# Patient Record
Sex: Female | Born: 1980 | Race: White | Hispanic: No | Marital: Married | Smoking: Never smoker
Health system: Southern US, Community
[De-identification: ages and names within clinical notes are randomized; demographics above are authoritative.]

## PROBLEM LIST (undated history)

## (undated) DIAGNOSIS — T7840XA Allergy, unspecified, initial encounter: Secondary | ICD-10-CM

## (undated) DIAGNOSIS — C801 Malignant (primary) neoplasm, unspecified: Secondary | ICD-10-CM

## (undated) DIAGNOSIS — I10 Essential (primary) hypertension: Secondary | ICD-10-CM

## (undated) DIAGNOSIS — Z0000 Encounter for general adult medical examination without abnormal findings: Secondary | ICD-10-CM

## (undated) DIAGNOSIS — B001 Herpesviral vesicular dermatitis: Principal | ICD-10-CM

## (undated) DIAGNOSIS — N939 Abnormal uterine and vaginal bleeding, unspecified: Secondary | ICD-10-CM

## (undated) DIAGNOSIS — Z1231 Encounter for screening mammogram for malignant neoplasm of breast: Secondary | ICD-10-CM

## (undated) DIAGNOSIS — E559 Vitamin D deficiency, unspecified: Secondary | ICD-10-CM

## (undated) HISTORY — DX: Essential (primary) hypertension: I10

## (undated) HISTORY — DX: Allergy, unspecified, initial encounter: T78.40XA

## (undated) HISTORY — DX: Malignant (primary) neoplasm, unspecified: C80.1

---

## 2018-02-16 ENCOUNTER — Telehealth: Payer: Self-pay | Admitting: Internal Medicine

## 2018-02-16 NOTE — Telephone Encounter (Signed)
Fax number is [phone]

## 2018-02-16 NOTE — Telephone Encounter (Signed)
Called patient and she will come by our office at her earliest convenience to sign a release form for us to fax to her previous cardiologist. [facility] as it may help them with chart prep.

## 2018-02-16 NOTE — Telephone Encounter (Signed)
[place] and establishing care with us [facility] calling in regards to transitioning medical records Patient requested they fax the information but they will need a fax from us Please advise [facility] also wanted to make office aware they may be unable to refill any medication as she has not been seen there in over a year

## 2018-02-17 NOTE — Telephone Encounter (Signed)
Thank you Victorino DikeJennifer for contacting patient!!  Yes, Pt needs to come by office to sign ROI for medical Records.

## 2018-03-11 ENCOUNTER — Ambulatory Visit: Payer: BLUE CROSS/BLUE SHIELD | Admitting: Physician Assistant

## 2018-03-11 ENCOUNTER — Encounter: Payer: Self-pay | Admitting: Physician Assistant

## 2018-03-11 DIAGNOSIS — N6314 Unspecified lump in the right breast, lower inner quadrant: Secondary | ICD-10-CM

## 2018-03-11 DIAGNOSIS — Z23 Encounter for immunization: Secondary | ICD-10-CM

## 2018-03-11 DIAGNOSIS — M722 Plantar fascial fibromatosis: Secondary | ICD-10-CM

## 2018-03-11 DIAGNOSIS — I10 Essential (primary) hypertension: Secondary | ICD-10-CM | POA: Diagnosis not present

## 2018-03-11 DIAGNOSIS — G5603 Carpal tunnel syndrome, bilateral upper limbs: Secondary | ICD-10-CM

## 2018-03-11 DIAGNOSIS — Z1239 Encounter for other screening for malignant neoplasm of breast: Secondary | ICD-10-CM

## 2018-03-11 DIAGNOSIS — L309 Dermatitis, unspecified: Secondary | ICD-10-CM

## 2018-03-11 MED ORDER — TRIAMCINOLONE ACETONIDE 0.1 % EX CREA: 1.0000 "application " | TOPICAL_CREAM | Freq: Two times a day (BID) | CUTANEOUS | 0 refills | Status: DC

## 2018-03-11 MED ORDER — LABETALOL HCL 100 MG PO TABS: 100.0000 mg | ORAL_TABLET | Freq: Every day | ORAL | 1 refills | Status: DC

## 2018-03-11 NOTE — Progress Notes (Signed)
Patient: Pamela Daniels, Female    DOB: [birth year], 37 y.o.   MRN: [id number] Visit Date: 03/17/2018  Today's Provider: [name], PA-C   Chief Complaint  Patient presents with  . New Patient (Initial Visit)   Subjective:   Patient here today to establish care, patient is transferring from [facility]. She moved here with her family after her husband got a job at [employer] working with the basketball team. She is an Network engineer.  Patient did not have a PCP, only OB/GYN. She believes she had a PAP smear within the past 3 years.  Patient is c/o lump on right breast patient reports is has been scanned and u/s and reports were normal. Reports she was told she could have it removed but at the time it was not bothering her. Now however, patient reports lump is getting bigger and reports it is beginning to bother her more frequently. She denies any discharge or skin changes in the area.  She has a history of tachycardia and elevated BP.Patient reports that her blood pressure has been well controlled and does want to come off medication. Previously when she had high blood pressure, she was taken off combo OCP and placed on mini pill.   Patient reports some swelling around ankles and pain in feet. She reports she has had pain in her feet since she was a teenager. It is deep pain, worse when she takes a step in the morning. She has gotten supportive orthotic shoes. She uses anti-inflammatories sparingly with some relief.  Patient reports numbness on hands, mostly in the first two fingers. Worse in the morning.   Also reports there is a spot on the back of her neck that has been there a while. It itches. It does not bleed. She reports she used some OTC steroid cream with some relief but not complete resolution. -----------------------------------------------------------------   Review of Systems  Constitutional: Negative.   HENT: Negative.   Eyes: Positive for pain and  itching.  Respiratory: Negative.   Cardiovascular: Positive for leg swelling.  Gastrointestinal: Negative.   Endocrine: Negative.   Musculoskeletal: Positive for back pain, neck pain and neck stiffness.  Skin: Positive for rash.  Allergic/Immunologic: Positive for environmental allergies.  Neurological: Positive for numbness.  Hematological: Negative.   Psychiatric/Behavioral: Negative.     Social History      She         Social History   Socioeconomic History  . Marital status: Married    Spouse name: Not on file  . Number of children: Not on file  . Years of education: Not on file  . Highest education level: Not on file  Occupational History  . Not on file  Social Needs  . Financial resource strain: Not on file  . Food insecurity:    Worry: Not on file    Inability: Not on file  . Transportation needs:    Medical: Not on file    Non-medical: Not on file  Tobacco Use  . Smoking status: Not on file  Substance and Sexual Activity  . Alcohol use: Not on file  . Drug use: Not on file  . Sexual activity: Not on file  Lifestyle  . Physical activity:    Days per week: Not on file    Minutes per session: Not on file  . Stress: Not on file  Relationships  . Social connections:    Talks on phone: Not on file  Gets together: Not on file    Attends religious service: Not on file    Active member of club or organization: Not on file    Attends meetings of clubs or organizations: Not on file    Relationship status: Not on file  Other Topics Concern  . Not on file  Social History Narrative  . Not on file    Past Medical History:  Diagnosis Date  . Allergy   . Hypertension      There are no active problems to display for this patient.   History reviewed. No pertinent surgical history.  Family History        Family Status  Relation Name Status  . Mother  Alive  . Father  Alive        Her family history includes Depression in her father; Glaucoma in her  father; Skin cancer in her father; Sleep apnea in her father.      No Known Allergies   Current Outpatient Medications:  .  labetalol (NORMODYNE) 100 MG tablet, Take 1 tablet (100 mg total) by mouth daily., Disp: 90 tablet, Rfl: 1 .  norethindrone (MICRONOR,CAMILA,ERRIN) 0.35 MG tablet, Take 1 tablet (0.35 mg total) by mouth daily., Disp: 3 Package, Rfl: 4 .  triamcinolone cream (KENALOG) 0.1 %, Apply 1 application topically 2 (two) times daily., Disp: 30 g, Rfl: 0 .  valACYclovir (VALTREX) 500 MG tablet, as needed., Disp: , Rfl: 0   Patient Care Team: Maryella Shivers as PCP - General (Physician Assistant)      Objective:   Vitals: There were no vitals taken for this visit.  There were no vitals filed for this visit.   Physical Exam  Constitutional: She is oriented to person, place, and time. She appears well-developed and well-nourished.  HENT:  Left Ear: External ear normal.  Mouth/Throat: Oropharynx is clear and moist.  Neck: Neck supple.  Cardiovascular: Normal rate and regular rhythm.  Pulmonary/Chest: Effort normal and breath sounds normal. Right breast exhibits mass. Right breast exhibits no inverted nipple, no nipple discharge, no skin change and no tenderness. Left breast exhibits no inverted nipple, no mass, no nipple discharge, no skin change and no tenderness. No breast swelling, tenderness, discharge or bleeding. Breasts are symmetrical.    Abdominal: Soft. Bowel sounds are normal.  Musculoskeletal: Normal range of motion. She exhibits no edema.  Phalens positive bilaterally.   Lymphadenopathy:    She has no cervical adenopathy.  Neurological: She is alert and oriented to person, place, and time.  Skin: Skin is warm and dry.     Psychiatric: She has a normal mood and affect. Her behavior is normal.     Depression Screen PHQ 2/9 Scores 03/11/2018  PHQ - 2 Score 0  PHQ- 9 Score 2      Assessment & Plan:     Routine Health Maintenance and  Physical Exam  Exercise Activities and Dietary recommendations Goals   None     Immunization History  Administered Date(s) Administered  . Influenza,inj,Quad PF,6+ Mos 03/11/2018  . Tdap 03/11/2018    Health Maintenance  Topic Date Due  . HIV Screening  06/19/1995  . PAP SMEAR  06/18/2001  . TETANUS/TDAP  03/11/2028  . INFLUENZA VACCINE  Completed     Discussed health benefits of physical activity, and encouraged her to engage in regular exercise appropriate for her age and condition.   1. Hypertension, unspecified type   - labetalol (NORMODYNE) 100 MG tablet; Take 1 tablet (  100 mg total) by mouth daily.  Dispense: 90 tablet; Refill: 1  2. Breast lump on right side at 4 o'clock position  - MM Digital Diagnostic Unilat R; Future - US BREAST LTD UNI RIGHT INC AXILLA; Future - MM DIAG BREAST TOMO BILATERAL; Future - US BREAST LTD UNI LEFT INC AXILLA; Future  3. Need for influenza vaccination  - Flu Vaccine QUAD 36+ mos IM  4. Need for Tdap vaccination  - Tdap vaccine greater than or equal to 7yo IM  5. Plantar fasciitis  - Ambulatory referral to Podiatry  6. Breast cancer screening  - MM Digital Screening Unilat L; Future  7. Eczema, unspecified type  - triamcinolone cream (KENALOG) 0.1 %; Apply 1 application topically 2 (two) times daily.  Dispense: 30 g; Refill: 0  8. Bilateral carpal tunnel syndrome  Advised on wearing braces.  Return in about 6 months (around 09/10/2018) for HTN.  The entirety of the information documented in the History of Present Illness, Review of Systems and Physical Exam were personally obtained by me. Portions of this information were initially documented by Rondel Baton, CMA and reviewed by me for thoroughness and accuracy.     --------------------------------------------------------------------    [name], PA-C  [facility]

## 2018-03-11 NOTE — Patient Instructions (Addendum)
Cock up wrist splint for carpal tunnel syndrome, wear every night for six weeks.     Breast Self-Awareness Breast self-awareness means being familiar with how your breasts look and feel. It involves checking your breasts regularly and reporting any changes to your health care provider. Practicing breast self-awareness is important. A change in your breasts can be a sign of a serious medical problem. Being familiar with how your breasts look and feel allows you to find any problems early, when treatment is more likely to be successful. All women should practice breast self-awareness, including women who have had breast implants. How to do a breast self-exam One way to learn what is normal for your breasts and whether your breasts are changing is to do a breast self-exam. To do a breast self-exam: Look for Changes  1. Remove all the clothing above your waist. 2. Stand in front of a mirror in a room with good lighting. 3. Put your hands on your hips. 4. Push your hands firmly downward. 5. Compare your breasts in the mirror. Look for differences between them (asymmetry), such as: ? Differences in shape. ? Differences in size. ? Puckers, dips, and bumps in one breast and not the other. 6. Look at each breast for changes in your skin, such as: ? Redness. ? Scaly areas. 7. Look for changes in your nipples, such as: ? Discharge. ? Bleeding. ? Dimpling. ? Redness. ? A change in position. Feel for Changes  Carefully feel your breasts for lumps and changes. It is best to do this while lying on your back on the floor and again while sitting or standing in the shower or tub with soapy water on your skin. Feel each breast in the following way:  Place the arm on the side of the breast you are examining above your head.  Feel your breast with the other hand.  Start in the nipple area and make  inch (2 cm) overlapping circles to feel your breast. Use the pads of your three middle fingers to do  this. Apply light pressure, then medium pressure, then firm pressure. The light pressure will allow you to feel the tissue closest to the skin. The medium pressure will allow you to feel the tissue that is a little deeper. The firm pressure will allow you to feel the tissue close to the ribs.  Continue the overlapping circles, moving downward over the breast until you feel your ribs below your breast.  Move one finger-width toward the center of the body. Continue to use the  inch (2 cm) overlapping circles to feel your breast as you move slowly up toward your collarbone.  Continue the up and down exam using all three pressures until you reach your armpit.  Write Down What You Find  Write down what is normal for each breast and any changes that you find. Keep a written record with breast changes or normal findings for each breast. By writing this information down, you do not need to depend only on memory for size, tenderness, or location. Write down where you are in your menstrual cycle, if you are still menstruating. If you are having trouble noticing differences in your breasts, do not get discouraged. With time you will become more familiar with the variations in your breasts and more comfortable with the exam. How often should I examine my breasts? Examine your breasts every month. If you are breastfeeding, the best time to examine your breasts is after a feeding or after using  a breast pump. If you menstruate, the best time to examine your breasts is 5-7 days after your period is over. During your period, your breasts are lumpier, and it may be more difficult to notice changes. When should I see my health care provider? See your health care provider if you notice:  A change in shape or size of your breasts or nipples.  A change in the skin of your breast or nipples, such as a reddened or scaly area.  Unusual discharge from your nipples.  A lump or thick area that was not there  before.  Pain in your breasts.  Anything that concerns you.  This information is not intended to replace advice given to you by your health care provider. Make sure you discuss any questions you have with your health care provider. Document Released: 05/18/2005 Document Revised: 10/24/2015 Document Reviewed: 04/07/2015 Elsevier Interactive Patient Education  [employer].

## 2018-03-15 ENCOUNTER — Telehealth: Payer: Self-pay | Admitting: Physician Assistant

## 2018-03-15 DIAGNOSIS — Z309 Encounter for contraceptive management, unspecified: Secondary | ICD-10-CM

## 2018-03-15 DIAGNOSIS — B001 Herpesviral vesicular dermatitis: Secondary | ICD-10-CM

## 2018-03-15 NOTE — Telephone Encounter (Signed)
Pt contacted office for refill request on the following medications:  1. norethindrone (MICRONOR,CAMILA,ERRIN) 0.35 MG tablet   2. valACYclovir (VALTREX) 500 MG tablet   [facility]  Please advise. Thanks TNP

## 2018-03-16 MED ORDER — NORETHINDRONE 0.35 MG PO TABS: 1.0000 | ORAL_TABLET | Freq: Every day | ORAL | 4 refills | Status: DC

## 2018-03-17 ENCOUNTER — Encounter: Payer: Self-pay | Admitting: Physician Assistant

## 2018-03-17 NOTE — Telephone Encounter (Signed)
Sent in OCP. How does she take Valtrex and for what?

## 2018-03-17 NOTE — Telephone Encounter (Signed)
Cold sore, 500 mg 1 tablet 2 times a day

## 2018-03-18 MED ORDER — VALACYCLOVIR HCL 500 MG PO TABS: 500.0000 mg | ORAL_TABLET | Freq: Two times a day (BID) | ORAL | 0 refills | Status: DC

## 2018-03-18 NOTE — Addendum Note (Signed)
Addended by: [name] on: 03/18/2018 11:09 AM   Modules accepted: Orders

## 2018-04-05 ENCOUNTER — Encounter: Payer: Self-pay | Admitting: Podiatry

## 2018-04-05 ENCOUNTER — Ambulatory Visit: Payer: BLUE CROSS/BLUE SHIELD | Admitting: Podiatry

## 2018-04-05 ENCOUNTER — Ambulatory Visit: Payer: Self-pay | Admitting: Physician Assistant

## 2018-04-05 ENCOUNTER — Other Ambulatory Visit: Payer: Self-pay | Admitting: Podiatry

## 2018-04-05 ENCOUNTER — Ambulatory Visit (INDEPENDENT_AMBULATORY_CARE_PROVIDER_SITE_OTHER): Payer: BLUE CROSS/BLUE SHIELD

## 2018-04-05 VITALS — BP 113/79 | HR 74

## 2018-04-05 DIAGNOSIS — M79671 Pain in right foot: Secondary | ICD-10-CM

## 2018-04-05 DIAGNOSIS — M722 Plantar fascial fibromatosis: Secondary | ICD-10-CM | POA: Diagnosis not present

## 2018-04-05 DIAGNOSIS — M79672 Pain in left foot: Principal | ICD-10-CM

## 2018-04-05 MED ORDER — MELOXICAM 15 MG PO TABS: 15.0000 mg | ORAL_TABLET | Freq: Every day | ORAL | 1 refills | Status: AC

## 2018-04-05 MED ORDER — METHYLPREDNISOLONE 4 MG PO TBPK: ORAL_TABLET | ORAL | 0 refills | Status: DC

## 2018-04-07 NOTE — Progress Notes (Signed)
   Subjective: 37 year old female presenting today as a new patient with a chief complaint of bilateral foot pain, mainly in the plantar aspects of the heels, that has been ongoing for several years. Standing for long periods of time increases the pain. She has not done anything for treatment. Patient is here for further evaluation and treatment.   Past Medical History:  Diagnosis Date  . Allergy   . Hypertension      Objective: Physical Exam General: The patient is alert and oriented x3 in no acute distress.  Dermatology: Skin is warm, dry and supple bilateral lower extremities. Negative for open lesions or macerations bilateral.   Vascular: Dorsalis Pedis and Posterior Tibial pulses palpable bilateral.  Capillary fill time is immediate to all digits.  Neurological: Epicritic and protective threshold intact bilateral.   Musculoskeletal: Tenderness to palpation to the plantar aspect of the bilateral heels along the plantar fascia. Limited ankle joint dorsiflexion of the left ankle. All other joints range of motion within normal limits bilateral. Strength 5/5 in all groups bilateral.   Radiographic exam: Normal osseous mineralization. Joint spaces preserved. No fracture/dislocation/boney destruction. No other soft tissue abnormalities or radiopaque foreign bodies.   Assessment: 1. plantar fasciitis bilateral feet, left greater than right  2. Equinus left   Plan of Care:  1. Patient evaluated. Xrays reviewed.   2. Injection of 0.5cc Celestone soluspan injected into the left heel.  3. Rx for Medrol Dose Pak placed 4. Rx for Meloxicam ordered for patient. 5. Plantar fascial band(s) dispensed for bilateral plantar fasciitis. 6. Instructed patient regarding therapies and modalities at home to alleviate symptoms.  7. Appointment with Raiford Noble, Pedorthist, for custom orthotics.  8. Return to clinic in 4 weeks.    Felecia Shelling, DPM [facility]  Dr. Felecia Shelling, DPM      [address] [place], [place] [address]                Office [phone]  Fax [phone]

## 2018-04-08 ENCOUNTER — Ambulatory Visit
Admission: RE | Admit: 2018-04-08 | Discharge: 2018-04-08 | Disposition: A | Discharge status: Home / Self Care | Payer: BLUE CROSS/BLUE SHIELD | Source: Ambulatory Visit | Attending: Physician Assistant | Admitting: Physician Assistant

## 2018-04-08 ENCOUNTER — Encounter: Payer: Self-pay | Admitting: Radiology

## 2018-04-08 DIAGNOSIS — N6314 Unspecified lump in the right breast, lower inner quadrant: Secondary | ICD-10-CM | POA: Insufficient documentation

## 2018-04-08 DIAGNOSIS — N6489 Other specified disorders of breast: Secondary | ICD-10-CM | POA: Diagnosis not present

## 2018-04-08 DIAGNOSIS — R922 Inconclusive mammogram: Secondary | ICD-10-CM | POA: Diagnosis not present

## 2018-04-13 ENCOUNTER — Ambulatory Visit (INDEPENDENT_AMBULATORY_CARE_PROVIDER_SITE_OTHER): Payer: BLUE CROSS/BLUE SHIELD | Admitting: Orthotics

## 2018-04-13 DIAGNOSIS — C44519 Basal cell carcinoma of skin of other part of trunk: Secondary | ICD-10-CM | POA: Diagnosis not present

## 2018-04-13 DIAGNOSIS — M722 Plantar fascial fibromatosis: Secondary | ICD-10-CM | POA: Diagnosis not present

## 2018-04-13 DIAGNOSIS — C4441 Basal cell carcinoma of skin of scalp and neck: Secondary | ICD-10-CM | POA: Diagnosis not present

## 2018-04-13 NOTE — Progress Notes (Signed)

## 2018-04-20 ENCOUNTER — Ambulatory Visit: Payer: Self-pay | Admitting: Internal Medicine

## 2018-04-25 DIAGNOSIS — C4441 Basal cell carcinoma of skin of scalp and neck: Secondary | ICD-10-CM | POA: Diagnosis not present

## 2018-04-25 DIAGNOSIS — C44519 Basal cell carcinoma of skin of other part of trunk: Secondary | ICD-10-CM | POA: Diagnosis not present

## 2018-05-04 ENCOUNTER — Ambulatory Visit (INDEPENDENT_AMBULATORY_CARE_PROVIDER_SITE_OTHER): Payer: BLUE CROSS/BLUE SHIELD | Admitting: Orthotics

## 2018-05-04 DIAGNOSIS — M722 Plantar fascial fibromatosis: Secondary | ICD-10-CM

## 2018-05-04 NOTE — Progress Notes (Signed)
Patient came in today to pick up custom made foot orthotics.  The goals were accomplished and the patient reported no dissatisfaction with said orthotics.  Patient was advised of breakin period and how to report any issues. 

## 2018-05-09 ENCOUNTER — Telehealth: Payer: Self-pay

## 2018-05-09 DIAGNOSIS — R079 Chest pain, unspecified: Secondary | ICD-10-CM

## 2018-05-09 NOTE — Telephone Encounter (Signed)
I reviewed her imaging results and see where they mentioned a possible diagnosis of costochondritis due to location of pain overlying some ribs. I do not see where they mention a need for xray and costochondritis would not show up on an xray as it is an inflammation of muscle. Was there another diagnosis they needed an xray for? I can get it for right sided rib pain to make sure bones and lungs look OK if she wants it.

## 2018-05-09 NOTE — Telephone Encounter (Signed)
Patient calling states that she had a mammogram and US done and it was normal. She reports that she was recommended to get and chest xray on the right side at that time and reports that she was advised that a note was going to send to [place].

## 2018-05-10 NOTE — Telephone Encounter (Signed)
Please order chest x ray

## 2018-05-10 NOTE — Telephone Encounter (Signed)
Ordered cxr and right sided rib x ray. She can get this at outpatient imaging center on walk in basis and I will see the results.

## 2018-05-11 NOTE — Telephone Encounter (Signed)
LMTCB

## 2018-05-12 ENCOUNTER — Ambulatory Visit
Admission: RE | Admit: 2018-05-12 | Discharge: 2018-05-12 | Disposition: A | Discharge status: Home / Self Care | Payer: BLUE CROSS/BLUE SHIELD | Attending: Physician Assistant | Admitting: Physician Assistant

## 2018-05-12 ENCOUNTER — Ambulatory Visit
Admission: RE | Admit: 2018-05-12 | Discharge: 2018-05-12 | Disposition: A | Discharge status: Home / Self Care | Payer: BLUE CROSS/BLUE SHIELD | Source: Ambulatory Visit | Attending: Physician Assistant | Admitting: Physician Assistant

## 2018-05-12 DIAGNOSIS — R079 Chest pain, unspecified: Secondary | ICD-10-CM | POA: Insufficient documentation

## 2018-05-12 DIAGNOSIS — R0789 Other chest pain: Secondary | ICD-10-CM | POA: Diagnosis not present

## 2018-05-12 DIAGNOSIS — R0781 Pleurodynia: Secondary | ICD-10-CM | POA: Diagnosis not present

## 2018-05-12 NOTE — Telephone Encounter (Signed)
Pt called back and informed.

## 2018-05-13 ENCOUNTER — Telehealth: Payer: Self-pay

## 2018-05-13 ENCOUNTER — Ambulatory Visit: Payer: BLUE CROSS/BLUE SHIELD | Admitting: Podiatry

## 2018-05-13 NOTE — Telephone Encounter (Signed)
Pt advised.   Thanks,   -Nazier Neyhart  

## 2018-05-13 NOTE — Telephone Encounter (Signed)
-----   Message from Trey SailorsAdriana M Pollak, [place] sent at 05/12/2018  4:30 PM EST ----- CXR and rib xray are normal.

## 2018-06-09 ENCOUNTER — Other Ambulatory Visit: Payer: Self-pay | Admitting: Physician Assistant

## 2018-06-09 DIAGNOSIS — B001 Herpesviral vesicular dermatitis: Secondary | ICD-10-CM

## 2018-06-13 ENCOUNTER — Other Ambulatory Visit: Payer: Self-pay

## 2018-06-13 NOTE — Telephone Encounter (Signed)
[facility] [address] Patient would like to have seasonale called in. Patient reports that she is unable to tolerate norethindrone. sd

## 2018-06-14 MED ORDER — LEVONORGEST-ETH ESTRAD 91-DAY 0.15-0.03 MG PO TABS: 1.0000 | ORAL_TABLET | Freq: Every day | ORAL | 4 refills | Status: DC

## 2018-06-15 DIAGNOSIS — Z85828 Personal history of other malignant neoplasm of skin: Secondary | ICD-10-CM | POA: Diagnosis not present

## 2018-06-15 DIAGNOSIS — Z08 Encounter for follow-up examination after completed treatment for malignant neoplasm: Secondary | ICD-10-CM | POA: Diagnosis not present

## 2018-06-15 DIAGNOSIS — D225 Melanocytic nevi of trunk: Secondary | ICD-10-CM | POA: Diagnosis not present

## 2018-07-05 ENCOUNTER — Other Ambulatory Visit: Payer: Self-pay | Admitting: Physician Assistant

## 2018-07-05 DIAGNOSIS — B001 Herpesviral vesicular dermatitis: Secondary | ICD-10-CM

## 2018-08-17 ENCOUNTER — Encounter: Payer: Self-pay | Admitting: Physician Assistant

## 2018-08-17 ENCOUNTER — Other Ambulatory Visit: Payer: Self-pay

## 2018-08-17 ENCOUNTER — Ambulatory Visit: Payer: BLUE CROSS/BLUE SHIELD | Admitting: Physician Assistant

## 2018-08-17 VITALS — BP 138/82 | HR 82 | Temp 97.9°F | Resp 16

## 2018-08-17 DIAGNOSIS — R300 Dysuria: Secondary | ICD-10-CM | POA: Diagnosis not present

## 2018-08-17 DIAGNOSIS — M549 Dorsalgia, unspecified: Secondary | ICD-10-CM

## 2018-08-17 DIAGNOSIS — R319 Hematuria, unspecified: Secondary | ICD-10-CM

## 2018-08-17 DIAGNOSIS — N12 Tubulo-interstitial nephritis, not specified as acute or chronic: Secondary | ICD-10-CM | POA: Diagnosis not present

## 2018-08-17 DIAGNOSIS — R110 Nausea: Secondary | ICD-10-CM

## 2018-08-17 LAB — POCT URINALYSIS DIPSTICK
Bilirubin, UA: NEGATIVE
Glucose, UA: NEGATIVE
Ketones, UA: NEGATIVE
Nitrite, UA: NEGATIVE
Protein, UA: POSITIVE — AB
Spec Grav, UA: >=1.03 — AB (ref 1.010–1.025)
Urobilinogen, UA: 0.2 E.U./dL
pH, UA: 6 (ref 5.0–8.0)

## 2018-08-17 MED ORDER — TAMSULOSIN HCL 0.4 MG PO CAPS: 0.4000 mg | ORAL_CAPSULE | Freq: Every day | ORAL | 1 refills | Status: DC

## 2018-08-17 MED ORDER — SULFAMETHOXAZOLE-TRIMETHOPRIM 800-160 MG PO TABS: 1.0000 | ORAL_TABLET | Freq: Two times a day (BID) | ORAL | 0 refills | Status: AC

## 2018-08-17 MED ORDER — ONDANSETRON HCL 4 MG PO TABS: 4.0000 mg | ORAL_TABLET | Freq: Three times a day (TID) | ORAL | 0 refills | Status: DC | PRN

## 2018-08-17 NOTE — Patient Instructions (Signed)

## 2018-08-17 NOTE — Progress Notes (Signed)
Patient: Pamela Daniels Female    DOB: [birth year]   38 y.o.   MRN: [id number] Visit Date: 08/17/2018  Today's Provider: [name], PA-C   Chief Complaint  Patient presents with  . Hematuria   Subjective:     Hematuria  This is a new problem. The current episode started yesterday (afternoon). The problem is unchanged. The hematuria occurs during the initial portion of her urinary stream. Clotting in stream: some in the toilet paper-little clots last one was at 3 am. After this urine has been regular yellow. Her pain is at a severity of 5/10. She describes her urine color as light pink (light pink-red due from 9 pm-3am). Irritative symptoms include frequency and urgency. Obstructive symptoms do not include incomplete emptying, an intermittent stream, a slower stream, straining or a weak stream. Associated symptoms include chills, dysuria (at the end of the stream) and nausea (last night). Pertinent negatives include no abdominal pain, facial swelling, fever or inability to urinate. She is sexually active. There is no history of kidney stones or recent infection.    Wt Readings from Last 3 Encounters:  No data found for Wt     No Known Allergies   Current Outpatient Medications:  .  levonorgestrel-ethinyl estradiol (SEASONALE,INTROVALE,JOLESSA) 0.15-0.03 MG tablet, Take 1 tablet by mouth daily., Disp: 1 Package, Rfl: 4 .  valACYclovir (VALTREX) 500 MG tablet, Take 1 tablet (500 mg total) by mouth 2 (two) times daily., Disp: 180 tablet, Rfl: 0 .  labetalol (NORMODYNE) 100 MG tablet, Take 1 tablet (100 mg total) by mouth daily., Disp: 90 tablet, Rfl: 1  Review of Systems  Constitutional: Positive for chills. Negative for fever.  HENT: Negative for facial swelling.   Gastrointestinal: Positive for nausea (last night). Negative for abdominal pain.  Genitourinary: Positive for dysuria (at the end of the stream), frequency, hematuria and urgency. Negative for incomplete  emptying.  Musculoskeletal: Positive for back pain ("Lower back").    Social History   Tobacco Use  . Smoking status: Never Smoker  . Smokeless tobacco: Never Used  Substance Use Topics  . Alcohol use: Not on file      Objective:   BP 138/82 (BP Location: Left Arm, Patient Position: Sitting, Cuff Size: Large)   Pulse 82   Temp 97.9 F (36.6 C) (Oral)   Resp 16  Vitals:   08/17/18 1105  BP: 138/82  Pulse: 82  Resp: 16  Temp: 97.9 F (36.6 C)  TempSrc: Oral     Physical Exam Constitutional:      Appearance: Normal appearance. She is not ill-appearing.  Cardiovascular:     Rate and Rhythm: Normal rate and regular rhythm.     Heart sounds: Normal heart sounds.  Pulmonary:     Breath sounds: Normal breath sounds.  Abdominal:     General: Abdomen is flat. Bowel sounds are normal.     Palpations: Abdomen is soft.     Tenderness: There is no abdominal tenderness. There is no right CVA tenderness or left CVA tenderness.  Skin:    General: Skin is warm and dry.  Neurological:     Mental Status: She is alert and oriented to person, place, and time. Mental status is at baseline.         Assessment & Plan    1. Pyelonephritis  Does sound more infectious rather than kidney stone, though I supposed she could have passed kidney stone which served as nidus of infection.  Will treat for pyelonephritis as below. Have also provided flomax in case patient has back pain again that may resemble stone. Defer imaging for right now as patient has improved. Counseled on return precautions.   - sulfamethoxazole-trimethoprim (BACTRIM DS,SEPTRA DS) 800-160 MG tablet; Take 1 tablet by mouth 2 (two) times daily for 7 days.  Dispense: 14 tablet; Refill: 0  2. Hematuria, unspecified type  - POCT urinalysis dipstick - Urine Culture - Urinalysis, microscopic only - sulfamethoxazole-trimethoprim (BACTRIM DS,SEPTRA DS) 800-160 MG tablet; Take 1 tablet by mouth 2 (two) times daily for 7 days.   Dispense: 14 tablet; Refill: 0  3. Dysuria  - Urine Culture  4. Acute bilateral back pain, unspecified back location  - tamsulosin (FLOMAX) 0.4 MG CAPS capsule; Take 1 capsule (0.4 mg total) by mouth daily.  Dispense: 30 capsule; Refill: 1  5. Nausea  - ondansetron (ZOFRAN) 4 MG tablet; Take 1 tablet (4 mg total) by mouth every 8 (eight) hours as needed for nausea or vomiting.  Dispense: 20 tablet; Refill: 0  The entirety of the information documented in the History of Present Illness, Review of Systems and Physical Exam were personally obtained by me. Portions of this information were initially documented by Hetty Ely, CMA and reviewed by me for thoroughness and accuracy.   F/u PRN      [name], PA-C  [facility]

## 2018-08-18 LAB — URINALYSIS, MICROSCOPIC ONLY
Casts: NONE SEEN /lpf
WBC, UA: >30 /hpf — AB (ref 0–5)

## 2018-08-19 ENCOUNTER — Telehealth: Payer: Self-pay

## 2018-08-19 LAB — URINE CULTURE

## 2018-08-19 NOTE — Telephone Encounter (Signed)
-----   Message from [name], [place] sent at 08/19/2018 12:36 PM EDT ----- Her urine culture came back positive for E. Coli which is sensitive to Bactrim. She should finish this. How is she feeling?

## 2018-08-19 NOTE — Telephone Encounter (Signed)
Patient advised as directed below.  Thanks,  -Dorinda Stehr 

## 2018-09-08 ENCOUNTER — Other Ambulatory Visit: Payer: Self-pay | Admitting: Physician Assistant

## 2018-09-08 DIAGNOSIS — M549 Dorsalgia, unspecified: Secondary | ICD-10-CM

## 2018-10-06 ENCOUNTER — Other Ambulatory Visit: Payer: Self-pay | Admitting: Physician Assistant

## 2018-10-06 DIAGNOSIS — B001 Herpesviral vesicular dermatitis: Secondary | ICD-10-CM

## 2018-11-07 ENCOUNTER — Other Ambulatory Visit: Payer: Self-pay | Admitting: Physician Assistant

## 2018-11-07 DIAGNOSIS — I10 Essential (primary) hypertension: Secondary | ICD-10-CM

## 2019-01-07 ENCOUNTER — Other Ambulatory Visit: Payer: Self-pay | Admitting: Physician Assistant

## 2019-01-07 DIAGNOSIS — B001 Herpesviral vesicular dermatitis: Secondary | ICD-10-CM

## 2019-01-18 DIAGNOSIS — Z85828 Personal history of other malignant neoplasm of skin: Secondary | ICD-10-CM | POA: Diagnosis not present

## 2019-01-18 DIAGNOSIS — Z08 Encounter for follow-up examination after completed treatment for malignant neoplasm: Secondary | ICD-10-CM | POA: Diagnosis not present

## 2019-01-18 DIAGNOSIS — Z1283 Encounter for screening for malignant neoplasm of skin: Secondary | ICD-10-CM | POA: Diagnosis not present

## 2019-03-14 ENCOUNTER — Encounter: Payer: Self-pay | Admitting: Physician Assistant

## 2019-04-09 ENCOUNTER — Other Ambulatory Visit: Payer: Self-pay | Admitting: Physician Assistant

## 2019-04-09 DIAGNOSIS — B001 Herpesviral vesicular dermatitis: Secondary | ICD-10-CM

## 2019-04-10 NOTE — Telephone Encounter (Signed)
L.O.V. was on 08/17/2018. Patient had appointment schedule for 03/14/2019 and canceled it.Please advise.

## 2019-05-11 ENCOUNTER — Other Ambulatory Visit: Payer: Self-pay | Admitting: Physician Assistant

## 2019-05-11 DIAGNOSIS — I10 Essential (primary) hypertension: Secondary | ICD-10-CM

## 2019-05-11 NOTE — Telephone Encounter (Signed)
Requested medication (s) are due for refill today: yes  Requested medication (s) are on the active medication list: yes  Last refill:  02/05/2019  Future visit scheduled: no  Notes to clinic:  Lov-08/17/2018 Review for refill   Requested Prescriptions  Pending Prescriptions Disp Refills   labetalol (NORMODYNE) 100 MG tablet [Pharmacy Med Name: LABETALOL HCL 100 MG TABLET] 90 tablet 1    Sig: TAKE 1 TABLET BY MOUTH EVERY DAY      Cardiovascular:  Beta Blockers Failed - 05/11/2019 11:22 AM      Failed - Valid encounter within last 6 months    Recent Outpatient Visits           8 months ago Pyelonephritis   [facility] [place], Fabio Bering M, PA-C   1 year ago Hypertension, unspecified type   [facility], Adriana M, [place]              Passed - Last BP in normal range    BP Readings from Last 1 Encounters:  08/17/18 138/82          Passed - Last Heart Rate in normal range    Pulse Readings from Last 1 Encounters:  08/17/18 82

## 2019-07-12 ENCOUNTER — Other Ambulatory Visit: Payer: Self-pay | Admitting: Physician Assistant

## 2019-07-12 DIAGNOSIS — B001 Herpesviral vesicular dermatitis: Secondary | ICD-10-CM

## 2019-07-12 NOTE — Telephone Encounter (Signed)
Requested Prescriptions  Pending Prescriptions Disp Refills  . valACYclovir (VALTREX) 500 MG tablet [Pharmacy Med Name: VALACYCLOVIR HCL 500 MG TABLET] 180 tablet 0    Sig: TAKE 1 TABLET BY MOUTH TWICE A DAY     Antimicrobials:  Antiviral Agents - Anti-Herpetic Passed - 07/12/2019  2:32 AM      Passed - Valid encounter within last 12 months    Recent Outpatient Visits          10 months ago Pyelonephritis   [facility], Ricki Rodriguez M, PA-C   1 year ago Hypertension, unspecified type   [facility], [place], [place]

## 2019-08-15 ENCOUNTER — Other Ambulatory Visit: Payer: Self-pay | Admitting: Physician Assistant

## 2019-08-15 NOTE — Telephone Encounter (Signed)
Refill request for Seasonale; last refill 05/27/20; no valid encounter within last 12 months; no upcoming visits noted; contacted pt and informed of above; she requested a virtual appt; pt offered and accepted virtual appt with Osvaldo Angst, [facility], 08/16/19 at 0940; she verbalized understanding; will grant courtesy refill; will route to office for notification. Requested Prescriptions  Pending Prescriptions Disp Refills  . levonorgestrel-ethinyl estradiol (SEASONALE) 0.15-0.03 MG tablet [Pharmacy Med Name: LEVONOR-ETH ESTRAD 0.15-0.03] 91 tablet 0    Sig: TAKE 1 TABLET BY MOUTH EVERY DAY     OB/GYN:  Contraceptives Failed - 08/15/2019 10:48 AM      Failed - Valid encounter within last 12 months    Recent Outpatient Visits          12 months ago Pyelonephritis   [facility], Ricki Rodriguez M, PA-C   1 year ago Hypertension, unspecified type   [facility], Adriana M, [place]             Passed - Last BP in normal range    BP Readings from Last 1 Encounters:  08/17/18 138/82

## 2019-08-16 ENCOUNTER — Ambulatory Visit (INDEPENDENT_AMBULATORY_CARE_PROVIDER_SITE_OTHER): Payer: BC Managed Care – PPO | Admitting: Physician Assistant

## 2019-08-16 DIAGNOSIS — Z3041 Encounter for surveillance of contraceptive pills: Secondary | ICD-10-CM | POA: Diagnosis not present

## 2019-08-16 DIAGNOSIS — B001 Herpesviral vesicular dermatitis: Secondary | ICD-10-CM | POA: Diagnosis not present

## 2019-08-16 DIAGNOSIS — I10 Essential (primary) hypertension: Secondary | ICD-10-CM

## 2019-08-16 MED ORDER — LEVONORGEST-ETH ESTRAD 91-DAY 0.15-0.03 MG PO TABS: 1.0000 | ORAL_TABLET | Freq: Every day | ORAL | 3 refills | Status: DC

## 2019-08-16 MED ORDER — VALACYCLOVIR HCL 500 MG PO TABS: 500.0000 mg | ORAL_TABLET | Freq: Two times a day (BID) | ORAL | 3 refills | Status: DC

## 2019-08-16 MED ORDER — LABETALOL HCL 100 MG PO TABS: 100.0000 mg | ORAL_TABLET | Freq: Every day | ORAL | 3 refills | Status: DC

## 2019-08-16 NOTE — Patient Instructions (Signed)

## 2019-08-16 NOTE — Progress Notes (Signed)
Patient: Pamela Daniels Female    DOB: 06-09-1980   39 y.o.   MRN: [id number] Visit Date: 08/16/2019  Today's Provider: Trinna Post, PA-C   Chief Complaint  Patient presents with  . Hypertension  . Contraception   Subjective:    Virtual Visit via Telephone Note  I connected with Pamela Daniels on 08/16/19 at  9:40 AM EDT by telephone and verified that I am speaking with the correct person using two identifiers.   I discussed the limitations, risks, security and privacy concerns of performing an evaluation and management service by telephone and the availability of in person appointments. I also discussed with the patient that there may be a patient responsible charge related to this service. The patient expressed understanding and agreed to proceed.    HPI  Hypertension, follow-up:  BP Readings from Last 3 Encounters:  08/17/18 138/82  04/05/18 113/79    She was last seen for hypertension 15 months ago.  BP at that visit was normal. Management changes since that visit include: labetalol 100 MG tablet; Take 1 tablet (100 mg total) by mouth daily. She reports good compliance with treatment. She is not having side effects.  She is exercising. She is adherent to low salt diet.   Outside blood pressures are normal. She is experiencing none.  Patient denies chest pain, chest pressure/discomfort, irregular heart beat and palpitations.   Cardiovascular risk factors include hypertension.  Use of agents associated with hypertension: none.     Weight trend: stable Wt Readings from Last 3 Encounters:  No data found for Wt    Current diet: well balanced  ------------------------------------------------------------------------ Contraception Patient presents today for birthcontrol refill. Patient is currently taking Seasonale 0.15-0.03 MG tablets. She is having no issues with this. 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   No Known Allergies   Current Outpatient Medications:  .  labetalol (NORMODYNE) 100 MG tablet, TAKE 1 TABLET BY MOUTH EVERY DAY, Disp: 90 tablet, Rfl: 1 .  levonorgestrel-ethinyl estradiol (SEASONALE) 0.15-0.03 MG tablet, TAKE 1 TABLET BY MOUTH EVERY DAY, Disp: 91 tablet, Rfl: 0 .  ondansetron (ZOFRAN) 4 MG tablet, Take 1 tablet (4 mg total) by mouth every 8 (eight) hours as needed for nausea or vomiting., Disp: 20 tablet, Rfl: 0 .  tamsulosin (FLOMAX) 0.4 MG CAPS capsule, Take 1 capsule (0.4 mg total) by mouth daily., Disp: 30 capsule, Rfl: 1 .  valACYclovir (VALTREX) 500 MG tablet, TAKE 1 TABLET BY MOUTH TWICE A DAY, Disp: 180 tablet, Rfl: 0  Review of Systems  Social History   Tobacco Use  . Smoking status: Never Smoker  . Smokeless tobacco: Never Used  Substance Use Topics  . Alcohol use: Not on file      Objective:   There were no vitals taken for this visit. There were no vitals filed for this visit.There is no height or weight on file to calculate BMI.   Physical Exam   No results found for any visits on 08/16/19.     Assessment & Plan    1. Hypertension, unspecified type  - labetalol (NORMODYNE) 100 MG tablet; Take 1 tablet (100 mg total) by mouth daily.  Dispense: 90 tablet; Refill: 3  2. Cold sore  - valACYclovir (VALTREX) 500 MG tablet; Take 1 tablet (500 mg total) by mouth 2 (two) times daily.  Dispense: 180 tablet; Refill: 3  3.  Encounter for surveillance of contraceptive pills  Counseled that she is due for a PAP smear. I will have my medical assistant reach out to her and schedule a CPE with a PAP where we can update that and her labwork.   - levonorgestrel-ethinyl estradiol (SEASONALE) 0.15-0.03 MG tablet; Take 1 tablet by mouth daily.  Dispense: 91 tablet; Refill: 3  The entirety of the information documented in the History of Present Illness, Review of Systems and Physical Exam were personally obtained by me.  Portions of this information were initially documented by [facility] and reviewed by me for thoroughness and accuracy.       [name], PA-C  [facility]

## 2019-09-19 ENCOUNTER — Ambulatory Visit
Admission: RE | Admit: 2019-09-19 | Discharge: 2019-09-19 | Disposition: A | Discharge status: Home / Self Care | Payer: BC Managed Care – PPO | Source: Ambulatory Visit | Attending: Physician Assistant | Admitting: Physician Assistant

## 2019-09-19 ENCOUNTER — Other Ambulatory Visit: Payer: Self-pay

## 2019-09-19 ENCOUNTER — Ambulatory Visit (INDEPENDENT_AMBULATORY_CARE_PROVIDER_SITE_OTHER): Payer: BC Managed Care – PPO | Admitting: Physician Assistant

## 2019-09-19 DIAGNOSIS — S6991XA Unspecified injury of right wrist, hand and finger(s), initial encounter: Secondary | ICD-10-CM

## 2019-09-19 DIAGNOSIS — M79644 Pain in right finger(s): Secondary | ICD-10-CM | POA: Diagnosis not present

## 2019-09-19 NOTE — Progress Notes (Addendum)
    Established patient visit    Patient: Pamela Daniels   DOB: 1981/02/28   39 y.o. Female  MRN: [id number] Visit Date: 09/19/2019  Today's healthcare provider: [name], PA-C   Chief Complaint  Patient presents with  . Hand Pain   Subjective    Hand Pain  The incident occurred more than 1 week ago. The pain is present in the right fingers.  Patient would like a x-ray for her right index finger. 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 She blocked this with her hand and jammed her finger. She got a splint for her right index finger for the past 3.5 weeks. The first few days she had some numbness and swelling. She doesn't have a full ROM in her right index finger. She is unable to touch her palm with her index finger. She can put some pressure on the finger but has noticed some weakness. She has been favoring this finger. She did break this right index finger years ago at the [facility].      Medications: Outpatient Medications Prior to Visit  Medication Sig  . labetalol (NORMODYNE) 100 MG tablet Take 1 tablet (100 mg total) by mouth daily.  Marland Kitchen levonorgestrel-ethinyl estradiol (SEASONALE) 0.15-0.03 MG tablet Take 1 tablet by mouth daily.  . valACYclovir (VALTREX) 500 MG tablet Take 1 tablet (500 mg total) by mouth 2 (two) times daily.   No facility-administered medications prior to visit.    Review of Systems  Constitutional: Negative.   Respiratory: Negative.   Genitourinary: Negative.   Hematological: Negative.        Objective    There were no vitals taken for this visit.   Physical Exam    No results found for any visits on 09/19/19.   Assessment & Plan    1. Injury of right index finger, initial encounter  Xray as below. NSAIDs and ice until xrays.   - DG Finger Index Right; Future - DG Hand Complete Right; Future   Return if symptoms worsen or fail to improve.      ITrey Sailors, PA-C, have reviewed all documentation for this visit. The documentation on 09/19/19 for the exam, diagnosis, procedures, and orders are all accurate and complete.    Maryella Shivers  [facility] [phone] (phone) [phone] (fax)  [facility]

## 2019-09-19 NOTE — Addendum Note (Signed)
Addended by: [name] on: 09/19/2019 03:06 PM   Modules accepted: Orders

## 2019-09-27 ENCOUNTER — Other Ambulatory Visit: Payer: Self-pay

## 2019-09-27 ENCOUNTER — Encounter: Payer: Self-pay | Admitting: Physician Assistant

## 2019-09-27 ENCOUNTER — Ambulatory Visit (INDEPENDENT_AMBULATORY_CARE_PROVIDER_SITE_OTHER): Payer: BC Managed Care – PPO | Admitting: Physician Assistant

## 2019-09-27 ENCOUNTER — Other Ambulatory Visit (HOSPITAL_COMMUNITY)
Admission: RE | Admit: 2019-09-27 | Discharge: 2019-09-27 | Disposition: A | Discharge status: Home / Self Care | Payer: BC Managed Care – PPO | Source: Ambulatory Visit | Attending: Physician Assistant | Admitting: Physician Assistant

## 2019-09-27 VITALS — BP 122/76 | HR 86 | Temp 97.3°F | Ht 65.0 in | Wt 148.2 lb

## 2019-09-27 DIAGNOSIS — Z3041 Encounter for surveillance of contraceptive pills: Secondary | ICD-10-CM

## 2019-09-27 DIAGNOSIS — Z0000 Encounter for general adult medical examination without abnormal findings: Secondary | ICD-10-CM | POA: Diagnosis not present

## 2019-09-27 DIAGNOSIS — Z124 Encounter for screening for malignant neoplasm of cervix: Secondary | ICD-10-CM | POA: Diagnosis not present

## 2019-09-27 DIAGNOSIS — I10 Essential (primary) hypertension: Secondary | ICD-10-CM | POA: Diagnosis not present

## 2019-09-27 DIAGNOSIS — Z8619 Personal history of other infectious and parasitic diseases: Secondary | ICD-10-CM

## 2019-09-27 DIAGNOSIS — I200 Unstable angina: Secondary | ICD-10-CM | POA: Diagnosis not present

## 2019-09-27 NOTE — Progress Notes (Signed)
Complete physical exam   Patient: Pamela Daniels   DOB: 19-Mar-1981   39 y.o. Female  MRN: [id number] Visit Date: 09/27/2019  Today's healthcare provider: [name], PA-C   Chief Complaint  Patient presents with  . Annual Exam  I,Porsha C McClurkin,acting as a scribe for [name], PA-C.,have documented all relevant documentation on the behalf of [name], PA-C,as directed by  [name], PA-C while in the presence of [name], PA-C.   Subjective    Pamela Daniels is a 39 y.o. female who presents today for a complete physical exam.  She reports consuming a general diet. Home exercise routine includes walking 1 hrs per day. She generally feels well. She reports sleeping fairly well. She does not have additional problems to discuss today.  HPI  Last mammogram:04/08/2018, normal Last pap: getting one today,09/27/2019  Patient reports left index finger wouldn't bend and was painful, but now it is fine.  Hypertension, follow-up  BP Readings from Last 3 Encounters:  09/27/19 122/76  08/17/18 138/82  04/05/18 113/79   Wt Readings from Last 3 Encounters:  09/27/19 148 lb 3.2 oz (67.2 kg)     She was last seen for hypertension 1 years ago.  BP at that visit was normal. Management since that visit includes continuing 100 mg labetalol QHS.  She reports excellent compliance with treatment. She is not having side effects.  She is following a Regular diet. She is exercising. She does not smoke.  Use of agents associated with hypertension: none.   Outside blood pressures are not checked.  Symptoms:  YES NO    []    [x]    Chest Pain   []    [x]    Chest pressure/discomfort   []    [x]    Palpitations   []    [x]    Dyspnea   []    [x]    Orthopnea   []    [x]    Paroxysmal nocturnal dyspnea   []    [x]    Lower extremity edema   []    [x]   Syncope   Pertinent labs: No results found for: CHOL, HDL, LDLCALC, LDLDIRECT, TRIG, CHOLHDL No results  found for: NA, K, CO2, GLUCOSE, BUN, CREATININE, CALCIUM, GFRNONAA, GFRAA, GLUCOSE   The ASCVD Risk score DC Jr., et al., 2013) failed to calculate for the following reasons:   The 2013 ASCVD risk score is only valid for ages 43 to 68   She continues with valtrex daily for cold sore prophylaxis.  ---------------------------------------------------------------------------------------------------  Past Medical History:  Diagnosis Date  . Allergy   . Hypertension    No past surgical history on file. Social History   Socioeconomic History  . Marital status: Married    Spouse name: Not on file  . Number of children: Not on file  . Years of education: Not on file  . Highest education level: Not on file  Occupational History  . Not on file  Tobacco Use  . Smoking status: Never Smoker  . Smokeless tobacco: Never Used  Substance and Sexual Activity  . Alcohol use: Not on file  . Drug use: Not on file  . Sexual activity: Not on file  Other Topics Concern  . Not on file  Social History Narrative  . Not on file   Social Determinants of Health   Financial Resource Strain:   . Difficulty of Paying Living Expenses:   Food Insecurity:   . Worried About  of Food in the Last Year:   . [place] in the Last Year:   Transportation Needs:   . Lack of Transportation (Medical):   Marland Kitchen Lack of Transportation (Non-Medical):   Physical Activity:   . Days of Exercise per Week:   . Minutes of Exercise per Session:   Stress:   . Feeling of Stress :   Social Connections:   . Frequency of Communication with Friends and Family:   . Frequency of Social Gatherings with Friends and Family:   . Attends Religious Services:   . Active Member of Clubs or Organizations:   . Attends Archivist Meetings:   Marland Kitchen Marital Status:   Intimate Partner Violence:   . Fear of Current or Ex-Partner:   . Emotionally Abused:   Marland Kitchen Physically Abused:   . Sexually Abused:    Family  Status  Relation Name Status  . Mother  Alive  . Father  Alive  . Ethlyn Daniels  (Not Specified)  . MGM  (Not Specified)   Family History  Problem Relation Age of Onset  . Glaucoma Father   . Sleep apnea Father   . Skin cancer Father   . Depression Father   . Breast cancer Paternal Aunt   . Breast cancer Maternal Grandmother    No Known Allergies  Patient Care Team: Paulene Floor as PCP - General (Physician Assistant)   Medications: Outpatient Medications Prior to Visit  Medication Sig  . labetalol (NORMODYNE) 100 MG tablet Take 1 tablet (100 mg total) by mouth daily.  Marland Kitchen levonorgestrel-ethinyl estradiol (SEASONALE) 0.15-0.03 MG tablet Take 1 tablet by mouth daily.  . valACYclovir (VALTREX) 500 MG tablet Take 1 tablet (500 mg total) by mouth 2 (two) times daily.   No facility-administered medications prior to visit.    Review of Systems  Constitutional: Negative.   HENT: Positive for ear discharge and tinnitus.   Eyes: Negative.   Respiratory: Negative.   Cardiovascular: Negative.   Gastrointestinal: Negative.   Endocrine: Negative.   Genitourinary: Negative.   Musculoskeletal: Positive for neck pain and neck stiffness.  Skin: Negative.   Allergic/Immunologic: Negative.   Neurological: Negative.   Hematological: Negative.   Psychiatric/Behavioral: Negative.       Objective    BP 122/76 (BP Location: Left Arm, Patient Position: Sitting, Cuff Size: Normal)   Pulse 86   Temp (!) 97.3 F (36.3 C) (Temporal)   Ht 5\' 5"  (1.651 m)   Wt 148 lb 3.2 oz (67.2 kg)   LMP 09/19/2019   SpO2 99%   BMI 24.66 kg/m    Physical Exam Constitutional:      Appearance: Normal appearance.  HENT:     Right Ear: Tympanic membrane, ear canal and external ear normal.     Left Ear: Tympanic membrane, ear canal and external ear normal.  Cardiovascular:     Rate and Rhythm: Normal rate and regular rhythm.     Pulses: Normal pulses.     Heart sounds: Normal heart sounds.   Pulmonary:     Effort: Pulmonary effort is normal.     Breath sounds: Normal breath sounds.  Abdominal:     General: Bowel sounds are normal.     Palpations: Abdomen is soft.     Tenderness: There is no abdominal tenderness.  Skin:    General: Skin is warm and dry.  Neurological:     General: No focal deficit present.     Mental Status: She  is alert and oriented to person, place, and time.  Psychiatric:        Mood and Affect: Mood normal.        Behavior: Behavior normal.       Depression Screen  PHQ 2/9 Scores 09/27/2019 03/11/2018  PHQ - 2 Score 0 0  PHQ- 9 Score 0 2    No results found for any visits on 09/27/19.  Assessment & Plan    1. Annual physical exam Breast exam was declined during the CPE today,09/27/2019. - HIV Antibody (routine testing w rflx) - TSH - Lipid panel - Comprehensive metabolic panel - CBC with Differential/Platelet  2. Cervical cancer screening - Cytology - PAP  3. Hypertension  Well controlled Continue current medications Recheck metabolic panel F/u in 12 months  4. H/o cold sores  Continue valtrex.  5, Encounter for surveillance of contraceptive pills  Continue OCP.    Routine Health Maintenance and Physical Exam  Exercise Activities and Dietary recommendations Goals   None     Immunization History  Administered Date(s) Administered  . Influenza Split 04/11/2008  . Influenza,inj,Quad PF,6+ Mos 03/11/2018  . PFIZER SARS-COV-2 Vaccination 09/11/2019  . Tdap 09/21/2009, 03/11/2018    Health Maintenance  Topic Date Due  . HIV Screening  Never done  . PAP SMEAR-Modifier  Never done  . COVID-19 Vaccine (2 - Pfizer 2-dose series) 10/02/2019  . INFLUENZA VACCINE  12/31/2019  . TETANUS/TDAP  03/11/2028    Discussed health benefits of physical activity, and encouraged her to engage in regular exercise appropriate for her age and condition.    Return in about 1 year (around 09/26/2020) for CPE.     ITrey Sailors, PA-C, have reviewed all documentation for this visit. The documentation on 09/27/19 for the exam, diagnosis, procedures, and orders are all accurate and complete.    Maryella Shivers  [facility] [phone] (phone) [phone] (fax)  [facility]

## 2019-09-28 LAB — CBC WITH DIFFERENTIAL/PLATELET
Basophils Absolute: 0 10*3/uL (ref 0.0–0.2)
Basos: 0 %
EOS (ABSOLUTE): 0.2 10*3/uL (ref 0.0–0.4)
Eos: 3 %
Hematocrit: 41.2 % (ref 34.0–46.6)
Hemoglobin: 14.1 g/dL (ref 11.1–15.9)
Immature Grans (Abs): 0 10*3/uL (ref 0.0–0.1)
Immature Granulocytes: 0 %
Lymphocytes Absolute: 1.6 10*3/uL (ref 0.7–3.1)
Lymphs: 25 %
MCH: 31.7 pg (ref 26.6–33.0)
MCHC: 34.2 g/dL (ref 31.5–35.7)
MCV: 93 fL (ref 79–97)
Monocytes Absolute: 0.3 10*3/uL (ref 0.1–0.9)
Monocytes: 5 %
Neutrophils Absolute: 4.2 10*3/uL (ref 1.4–7.0)
Neutrophils: 67 %
Platelets: 206 10*3/uL (ref 150–450)
RBC: 4.45 x10E6/uL (ref 3.77–5.28)
RDW: 12.5 % (ref 11.7–15.4)
WBC: 6.3 10*3/uL (ref 3.4–10.8)

## 2019-09-28 LAB — COMPREHENSIVE METABOLIC PANEL
ALT: 14 IU/L (ref 0–32)
AST: 15 IU/L (ref 0–40)
Albumin/Globulin Ratio: 2.3 — ABNORMAL HIGH (ref 1.2–2.2)
Albumin: 4.6 g/dL (ref 3.8–4.8)
Alkaline Phosphatase: 53 IU/L (ref 39–117)
BUN/Creatinine Ratio: 13 (ref 9–23)
BUN: 10 mg/dL (ref 6–20)
Bilirubin Total: 0.5 mg/dL (ref 0.0–1.2)
CO2: 21 mmol/L (ref 20–29)
Calcium: 9.2 mg/dL (ref 8.7–10.2)
Chloride: 105 mmol/L (ref 96–106)
Creatinine, Ser: 0.8 mg/dL (ref 0.57–1.00)
GFR calc Af Amer: 107 mL/min/{1.73_m2} (ref >59)
GFR calc non Af Amer: 93 mL/min/{1.73_m2} (ref >59)
Globulin, Total: 2 g/dL (ref 1.5–4.5)
Glucose: 86 mg/dL (ref 65–99)
Potassium: 4.2 mmol/L (ref 3.5–5.2)
Sodium: 141 mmol/L (ref 134–144)
Total Protein: 6.6 g/dL (ref 6.0–8.5)

## 2019-09-28 LAB — LIPID PANEL
Chol/HDL Ratio: 2.7 ratio (ref 0.0–4.4)
Cholesterol, Total: 174 mg/dL (ref 100–199)
HDL: 65 mg/dL
LDL Chol Calc (NIH): 95 mg/dL (ref 0–99)
Triglycerides: 77 mg/dL (ref 0–149)
VLDL Cholesterol Cal: 14 mg/dL (ref 5–40)

## 2019-09-28 LAB — CYTOLOGY - PAP
Comment: NEGATIVE
Diagnosis: NEGATIVE
High risk HPV: NEGATIVE

## 2019-09-28 LAB — HIV ANTIBODY (ROUTINE TESTING W REFLEX): HIV Screen 4th Generation wRfx: NONREACTIVE

## 2019-09-28 LAB — TSH: TSH: 1.43 u[IU]/mL (ref 0.450–4.500)

## 2019-09-29 ENCOUNTER — Encounter: Payer: Self-pay | Admitting: Physician Assistant

## 2019-09-29 DIAGNOSIS — S6991XS Unspecified injury of right wrist, hand and finger(s), sequela: Secondary | ICD-10-CM

## 2019-10-10 DIAGNOSIS — S63630D Sprain of interphalangeal joint of right index finger, subsequent encounter: Secondary | ICD-10-CM | POA: Diagnosis not present

## 2020-04-24 ENCOUNTER — Telehealth: Payer: Self-pay

## 2020-04-24 NOTE — Telephone Encounter (Signed)
Fine to schedule OV in office.

## 2020-04-24 NOTE — Telephone Encounter (Signed)
Please advise. Thanks.  

## 2020-04-24 NOTE — Telephone Encounter (Signed)
Copied from CRM [id number]. Topic: Appointment Scheduling - Scheduling Inquiry for Clinic >> Apr 24, 2020 10:36 AM Randol Kern wrote: Reason for CRM: Pt scheduled appt, states she has had a slight cough for 7 weeks and ear pressure for 6 months. No other symptoms/exposure.. Wants to come in the office for BP check, please advise if she must do virtual instead  Best contact:

## 2020-04-29 NOTE — Telephone Encounter (Signed)
Patient is scheduled for 04/30/2020

## 2020-04-30 ENCOUNTER — Encounter: Payer: Self-pay | Admitting: Physician Assistant

## 2020-04-30 ENCOUNTER — Ambulatory Visit: Payer: BC Managed Care – PPO | Admitting: Physician Assistant

## 2020-04-30 ENCOUNTER — Other Ambulatory Visit: Payer: Self-pay

## 2020-04-30 VITALS — BP 151/99 | HR 93 | Temp 98.5°F | Wt 144.4 lb

## 2020-04-30 DIAGNOSIS — N889 Noninflammatory disorder of cervix uteri, unspecified: Secondary | ICD-10-CM

## 2020-04-30 DIAGNOSIS — I10 Essential (primary) hypertension: Secondary | ICD-10-CM

## 2020-04-30 MED ORDER — LABETALOL HCL 100 MG PO TABS: 100.0000 mg | ORAL_TABLET | Freq: Two times a day (BID) | ORAL | 1 refills | Status: DC

## 2020-04-30 NOTE — Progress Notes (Signed)
Established patient visit   Patient: Pamela Daniels   DOB: 05-22-1981   39 y.o. Female  MRN: [id number] Visit Date: 04/30/2020  Today's healthcare provider: [name], PA-C   Chief Complaint  Patient presents with  . Hypertension  . Ear Fullness  I,Porsha C McClurkin,acting as a scribe for [name], PA-C.,have documented all relevant documentation on the behalf of [name], PA-C,as directed by  [name], PA-C while in the presence of [name], PA-C. Subjective    Ear Fullness  There is pain in both ears. This is a new problem. The current episode started more than 1 month ago. The problem occurs constantly. The problem has been unchanged. There has been no fever. The pain is at a severity of 0/10. The patient is experiencing no pain. Associated symptoms include hearing loss. Pertinent negatives include no diarrhea, ear discharge, headaches or rhinorrhea. She has tried nothing for the symptoms. The treatment provided no relief.    Hypertension Patient reports her blood pressure has been elevated for the last 3-4 weeks since her brother in law passing in a farming accident. She has been spending the past month going back and forth to [place] where her sister lives. She reports that one night she had stabbing pain and chest pressure with tingling in left arm. It happened for a few minutes and then resolved. She reports her blood pressure readings are arranging from 130's-150's/90's-100's.  Reports ears have been more full lately and she has had some difficulty hearing.  She also reports she has had some pain with sex. She reports some scant vaginal bleeding outside of periods. She also reports using additional lubrication without success. She has not had this issue before.      Medications: Outpatient Medications Prior to Visit  Medication Sig  . levonorgestrel-ethinyl estradiol (SEASONALE) 0.15-0.03 MG tablet Take 1 tablet by mouth  daily.  . valACYclovir (VALTREX) 500 MG tablet Take 1 tablet (500 mg total) by mouth 2 (two) times daily.  . [DISCONTINUED] labetalol (NORMODYNE) 100 MG tablet Take 1 tablet (100 mg total) by mouth daily.   No facility-administered medications prior to visit.    Review of Systems  HENT: Positive for hearing loss. Negative for ear discharge and rhinorrhea.   Gastrointestinal: Negative for diarrhea.  Neurological: Negative for headaches.      Objective    BP (!) 151/99 (BP Location: Left Arm, Patient Position: Sitting, Cuff Size: Large)   Pulse 93   Temp 98.5 F (36.9 C) (Oral)   Wt 144 lb 6.4 oz (65.5 kg)   SpO2 100%   BMI 24.03 kg/m    Physical Exam Constitutional:      Appearance: Normal appearance.  Cardiovascular:     Rate and Rhythm: Normal rate and regular rhythm.     Heart sounds: Normal heart sounds.  Genitourinary:    Skin:    General: Skin is warm and dry.  Neurological:     General: No focal deficit present.     Mental Status: She is alert and oriented to person, place, and time.  Psychiatric:        Mood and Affect: Mood normal.        Behavior: Behavior normal.       No results found for any visits on 04/30/20.  Assessment & Plan     1. Hypertension, unspecified type  May be due to stress of current situation. Will increase labetalol to 100  mg BID. Offered counseling which patient thinks she would like after the holidays. May consider SSRI in the future if BP not controlled.  - labetalol (NORMODYNE) 100 MG tablet; Take 1 tablet (100 mg total) by mouth 2 (two) times daily.  Dispense: 180 tablet; Refill: 1  2. Cervical Lesion  May be related to painful sex. Will have her see OBGYN. If issues do not resolve, may consider pelvic floor PT.  No follow-ups on file.      ITrey Sailors, PA-C, have reviewed all documentation for this visit. The documentation on 04/30/20 for the exam, diagnosis, procedures, and orders are all accurate and  complete.  The entirety of the information documented in the History of Present Illness, Review of Systems and Physical Exam were personally obtained by me. Portions of this information were initially documented by [facility] and reviewed by me for thoroughness and accuracy.      Maryella Shivers  [facility] [phone] (phone) [phone] (fax)  [facility]

## 2020-05-13 DIAGNOSIS — B070 Plantar wart: Secondary | ICD-10-CM | POA: Diagnosis not present

## 2020-05-13 DIAGNOSIS — D225 Melanocytic nevi of trunk: Secondary | ICD-10-CM | POA: Diagnosis not present

## 2020-05-13 DIAGNOSIS — L821 Other seborrheic keratosis: Secondary | ICD-10-CM | POA: Diagnosis not present

## 2020-06-19 ENCOUNTER — Other Ambulatory Visit: Payer: Self-pay

## 2020-06-19 ENCOUNTER — Encounter: Payer: Self-pay | Admitting: Obstetrics and Gynecology

## 2020-06-19 ENCOUNTER — Ambulatory Visit (INDEPENDENT_AMBULATORY_CARE_PROVIDER_SITE_OTHER): Payer: BC Managed Care – PPO | Admitting: Obstetrics and Gynecology

## 2020-06-19 ENCOUNTER — Ambulatory Visit: Payer: BC Managed Care – PPO | Admitting: Obstetrics & Gynecology

## 2020-06-19 VITALS — BP 120/80 | Ht 65.0 in | Wt 148.0 lb

## 2020-06-19 DIAGNOSIS — N941 Unspecified dyspareunia: Secondary | ICD-10-CM | POA: Diagnosis not present

## 2020-06-19 DIAGNOSIS — N930 Postcoital and contact bleeding: Secondary | ICD-10-CM | POA: Diagnosis not present

## 2020-06-19 MED ORDER — DOXYCYCLINE HYCLATE 100 MG PO CAPS: 100.0000 mg | ORAL_CAPSULE | Freq: Two times a day (BID) | ORAL | 0 refills | Status: DC

## 2020-06-19 NOTE — Progress Notes (Signed)
[name], PA-C   Chief Complaint  Patient presents with  . Vaginal Exam    Two vag lumps noticed by PCP, pt was having pain during intercourse and bleeding after intercourse since July    HPI:      Ms. Pamela Daniels is a 40 y.o. No obstetric history on file. whose LMP was No LMP recorded., presents today for NP eval of postcoital bleeding and dyspareunia since 7/21. Pt with bright red bleeding after sex that lasts till the next day. Was occurring after each episode but hasn't had sx in past 2 wks. Also with vaginal pain and sharp pelvic pains during sex. Sx resolve after sex but sometimes pelvic area was tender the next day. Sx also improved past 2 wks. Never had these sx before. Married for many yrs, no new sex partners. No urin, vag, GI sx. No unusual LBP, no fevers. No pelvic pain otherwise. FH ovar cysts but pt has never had one. Menses are Q3 months with OCPs, last 6 days, no BTB, mild dysmen, improved with NSAIDs.  Neg pap/neg HPV DNA 4/21 with PCP.   Has had numbness of LT index finger/part of hand recently, and plans to see PCP.   Past Medical History:  Diagnosis Date  . Allergy   . Hypertension     History reviewed. No pertinent surgical history.  Family History  Problem Relation Age of Onset  . Glaucoma Father   . Sleep apnea Father   . Skin cancer Father   . Depression Father   . Breast cancer Paternal Aunt        50s/60s  . Breast cancer Maternal Grandmother        late yrs    Social History   Socioeconomic History  . Marital status: Married    Spouse name: Not on file  . Number of children: Not on file  . Years of education: Not on file  . Highest education level: Not on file  Occupational History  . Not on file  Tobacco Use  . Smoking status: Never Smoker  . Smokeless tobacco: Never Used  Vaping Use  . Vaping Use: Never used  Substance and Sexual Activity  . Alcohol use: Not Currently  . Drug use: Not Currently  . Sexual activity:  Yes    Birth control/protection: Pill  Other Topics Concern  . Not on file  Social History Narrative  . Not on file   Social Determinants of Health   Financial Resource Strain: Not on file  Food Insecurity: Not on file  Transportation Needs: Not on file  Physical Activity: Not on file  Stress: Not on file  Social Connections: Not on file  Intimate Partner Violence: Not on file    Outpatient Medications Prior to Visit  Medication Sig Dispense Refill  . labetalol (NORMODYNE) 100 MG tablet Take 1 tablet (100 mg total) by mouth 2 (two) times daily. 180 tablet 1  . levonorgestrel-ethinyl estradiol (SEASONALE) 0.15-0.03 MG tablet Take 1 tablet by mouth daily. 91 tablet 3  . valACYclovir (VALTREX) 500 MG tablet Take 1 tablet (500 mg total) by mouth 2 (two) times daily. 180 tablet 3   No facility-administered medications prior to visit.      ROS:  Review of Systems  Constitutional: Positive for fatigue. Negative for fever and unexpected weight change.  Respiratory: Negative for cough, shortness of breath and wheezing.   Cardiovascular: Negative for chest pain, palpitations and leg swelling.  Gastrointestinal: Negative for  blood in stool, constipation, diarrhea, nausea and vomiting.  Endocrine: Negative for cold intolerance, heat intolerance and polyuria.  Genitourinary: Positive for dyspareunia and vaginal bleeding. Negative for dysuria, flank pain, frequency, genital sores, hematuria, menstrual problem, pelvic pain, urgency, vaginal discharge and vaginal pain.  Musculoskeletal: Negative for back pain, joint swelling and myalgias.  Skin: Negative for rash.  Neurological: Positive for numbness. Negative for dizziness, syncope, light-headedness and headaches.  Hematological: Negative for adenopathy.  Psychiatric/Behavioral: Negative for agitation, confusion, sleep disturbance and suicidal ideas. The patient is not nervous/anxious.     OBJECTIVE:   Vitals:  BP 120/80   Ht 5\' 5"   (1.651 m)   Wt 148 lb (67.1 kg)   BMI 24.63 kg/m   Physical Exam Vitals reviewed.  Constitutional:      Appearance: She is well-developed.  Pulmonary:     Effort: Pulmonary effort is normal.  Genitourinary:    General: Normal vulva.     Pubic Area: No rash.      Labia:        Right: No rash, tenderness or lesion.        Left: No rash, tenderness or lesion.      Vagina: Normal. No vaginal discharge, erythema or tenderness.     Cervix: Friability and cervical bleeding present.     Uterus: Normal. Not enlarged and not tender.      Adnexa: Right adnexa normal and left adnexa normal.       Right: No mass or tenderness.         Left: No mass or tenderness.       Comments: FRIABLE CX/BLEEDING WITH PAP BRUSH Musculoskeletal:        General: Normal range of motion.     Cervical back: Normal range of motion.  Skin:    General: Skin is warm and dry.  Neurological:     General: No focal deficit present.     Mental Status: She is alert and oriented to person, place, and time.  Psychiatric:        Mood and Affect: Mood normal.        Behavior: Behavior normal.        Thought Content: Thought content normal.        Judgment: Judgment normal.     Assessment/Plan: Postcoital bleeding - Plan: doxycycline (VIBRAMYCIN) 100 MG capsule; friable cx, neg pap 4/21. Rx doxy. F/u prn. If sx persist, will check GYN u/s.   Dyspareunia in female--sx for 6 months but improving now. Treat with doxy. Pt to RTO for GYN u/s if sx then persist.    Meds ordered this encounter  Medications  . doxycycline (VIBRAMYCIN) 100 MG capsule    Sig: Take 1 capsule (100 mg total) by mouth 2 (two) times daily.    Dispense:  14 capsule    Refill:  0    Order Specific Question:   Supervising Provider    Answer:   5/21 Nadara Mustard      Return if symptoms worsen or fail to improve.  [name] B. [name], PA-C 06/19/2020 3:56 PM

## 2020-06-19 NOTE — Patient Instructions (Signed)
I value your feedback and you entrusting us with your care. If you get a [facility] patient survey, I would appreciate you taking the time to let us know about your experience today. Thank you! ? ? ?

## 2020-06-27 ENCOUNTER — Telehealth: Payer: Self-pay

## 2020-06-27 NOTE — Telephone Encounter (Signed)
Ok to switch PCP and schedule appt for arm numbness for next week with me

## 2020-06-27 NOTE — Telephone Encounter (Signed)
Copied from CRM [id number]. Topic: Appointment Scheduling - Scheduling Inquiry for Clinic >> Jun 25, 2020 11:59 AM Leafy Ro wrote: Reason for CRM: Pt no longer would like to see Ricki Rodriguez and would like to switch to new provider any other female doctor. Pt has been having arm numbness for over a month. Please advise

## 2020-07-04 ENCOUNTER — Ambulatory Visit: Payer: BC Managed Care – PPO | Admitting: Family Medicine

## 2020-07-04 ENCOUNTER — Encounter: Payer: Self-pay | Admitting: Family Medicine

## 2020-07-04 ENCOUNTER — Other Ambulatory Visit: Payer: Self-pay

## 2020-07-04 VITALS — BP 116/82 | HR 77 | Temp 98.5°F | Resp 16 | Ht 65.0 in | Wt 148.0 lb

## 2020-07-04 DIAGNOSIS — R200 Anesthesia of skin: Secondary | ICD-10-CM | POA: Diagnosis not present

## 2020-07-04 DIAGNOSIS — H6983 Other specified disorders of Eustachian tube, bilateral: Secondary | ICD-10-CM

## 2020-07-04 DIAGNOSIS — H6993 Unspecified Eustachian tube disorder, bilateral: Secondary | ICD-10-CM | POA: Insufficient documentation

## 2020-07-04 MED ORDER — GABAPENTIN 300 MG PO CAPS: 300.0000 mg | ORAL_CAPSULE | Freq: Every day | ORAL | 3 refills | Status: DC

## 2020-07-04 NOTE — Progress Notes (Signed)
Established patient visit   Patient: Pamela Daniels   DOB: 18-Dec-1980   40 y.o. Female  MRN: [id number] Visit Date: 07/04/2020  Today's healthcare provider: Shirlee Latch, MD   Chief Complaint  Patient presents with  . Numbness  . Ear Fullness   Subjective    HPI  Patient here today C/O numbness on left arm and left index finger x 5-6 weeks. Patient reports numbness on finger is constant and arm numbness and tingling is on and off. Patient reports numbness and tingling does radiate up and down arm.   Patient C/O b/l ear fullness x several months. Patient reports she was advised to take OTC Allergy med (Zyrtec, Flonase), she reports she took one daily for a month and reports no improvement. Patient reports she was told she had fluid in her ears. Decreased hearing.  Allergies worse since moving to [place]    Patient Active Problem List   Diagnosis Date Noted  . Arm numbness left 07/04/2020  . Eustachian tube dysfunction, bilateral 07/04/2020   Social History   Tobacco Use  . Smoking status: Never Smoker  . Smokeless tobacco: Never Used  Vaping Use  . Vaping Use: Never used  Substance Use Topics  . Alcohol use: Not Currently  . Drug use: Not Currently   No Known Allergies     Medications: Outpatient Medications Prior to Visit  Medication Sig  . cetirizine (ZYRTEC) 10 MG tablet Take 10 mg by mouth daily.  Marland Kitchen labetalol (NORMODYNE) 100 MG tablet Take 1 tablet (100 mg total) by mouth 2 (two) times daily.  Marland Kitchen levonorgestrel-ethinyl estradiol (SEASONALE) 0.15-0.03 MG tablet Take 1 tablet by mouth daily.  . valACYclovir (VALTREX) 500 MG tablet Take 1 tablet (500 mg total) by mouth 2 (two) times daily.  . [DISCONTINUED] doxycycline (VIBRAMYCIN) 100 MG capsule Take 1 capsule (100 mg total) by mouth 2 (two) times daily.   No facility-administered medications prior to visit.    Review of Systems  Constitutional: Negative for chills, fatigue and fever.  HENT: Positive for  congestion. Negative for ear pain.        Ear fullness  Respiratory: Negative for cough and chest tightness.   Cardiovascular: Negative for chest pain and palpitations.  Neurological: Positive for numbness.    Last CBC Lab Results  Component Value Date   WBC 6.3 09/27/2019   HGB 14.1 09/27/2019   HCT 41.2 09/27/2019   MCV 93 09/27/2019   MCH 31.7 09/27/2019   RDW 12.5 09/27/2019   PLT 206 09/27/2019   Last metabolic panel Lab Results  Component Value Date   GLUCOSE 86 09/27/2019   NA 141 09/27/2019   K 4.2 09/27/2019   CL 105 09/27/2019   CO2 21 09/27/2019   BUN 10 09/27/2019   CREATININE 0.80 09/27/2019   GFRNONAA 93 09/27/2019   GFRAA 107 09/27/2019   CALCIUM 9.2 09/27/2019   PROT 6.6 09/27/2019   ALBUMIN 4.6 09/27/2019   LABGLOB 2.0 09/27/2019   AGRATIO 2.3 (H) 09/27/2019   BILITOT 0.5 09/27/2019   ALKPHOS 53 09/27/2019   AST 15 09/27/2019   ALT 14 09/27/2019   Last lipids Lab Results  Component Value Date   CHOL 174 09/27/2019   HDL 65 09/27/2019   LDLCALC 95 09/27/2019   TRIG 77 09/27/2019   CHOLHDL 2.7 09/27/2019        Objective    BP 116/82 (BP Location: Left Arm, Patient Position: Sitting, Cuff Size: Normal)   Pulse 77  Temp 98.5 F (36.9 C) (Oral)   Resp 16   Ht 5\' 5"  (1.651 m)   Wt 148 lb (67.1 kg)   LMP 04/03/2020 (Approximate)   SpO2 99%   BMI 24.63 kg/m  BP Readings from Last 3 Encounters:  07/04/20 116/82  06/19/20 120/80  04/30/20 (!) 151/99   Wt Readings from Last 3 Encounters:  07/04/20 148 lb (67.1 kg)  06/19/20 148 lb (67.1 kg)  04/30/20 144 lb 6.4 oz (65.5 kg)      Physical Exam Constitutional:      General: She is not in acute distress.    Appearance: Normal appearance.  HENT:     Head: Normocephalic.     Right Ear: Tympanic membrane, ear canal and external ear normal.     Left Ear: Tympanic membrane, ear canal and external ear normal.  Eyes:     General: No scleral icterus.    Conjunctiva/sclera:  Conjunctivae normal.  Cardiovascular:     Rate and Rhythm: Normal rate and regular rhythm.  Pulmonary:     Effort: Pulmonary effort is normal. No respiratory distress.  Musculoskeletal:     Cervical back: Neck supple. No rigidity.     Right lower leg: No edema.     Left lower leg: No edema.  Lymphadenopathy:     Cervical: No cervical adenopathy.  Skin:    Findings: No rash.  Neurological:     Mental Status: She is alert and oriented to person, place, and time.     Cranial Nerves: No cranial nerve deficit.     Sensory: Sensory deficit (decreased sensation over L index finger) present.     Motor: No weakness.     Comments: Negative Spurlings bilaterally. Possibly with some thenar atrophy on L side, though patient is R hand dominant  Psychiatric:        Mood and Affect: Mood normal.        Behavior: Behavior normal.      No results found for any visits on 07/04/20.  Assessment & Plan     Problem List Items Addressed This Visit      Nervous and Auditory   Eustachian tube dysfunction, bilateral    Long-standing issue No relief with zyrtec Benign ear exam today Encourage regular use of flonase  Discussed natural course and return precautions        Other   Arm numbness left - Primary    Ongoing for several weeks H/o b/l carpal tunnel syndrome, but now with persistent numbness of L index finger and intermittent paraesthesias up L arm No neck TTP  Discussed that she likely needs NCS/EMG to determine location of impingement Will start gabapentin qhs for symptom management Referral to Neuro for further evaluation (of note, patient's father has Parkinson's and she worries about neurologic issues)       Relevant Orders   Ambulatory referral to Neurology       Return in about 2 months (around 09/01/2020) for CPE.      I, 11/01/2020, MD, have reviewed all documentation for this visit. The documentation on 07/04/20 for the exam, diagnosis, procedures, and orders are  all accurate and complete.   Kaylani Fromme, 09/01/20, MD, MPH [facility]

## 2020-07-04 NOTE — Patient Instructions (Signed)

## 2020-07-04 NOTE — Assessment & Plan Note (Signed)
Long-standing issue No relief with zyrtec Benign ear exam today Encourage regular use of flonase  Discussed natural course and return precautions

## 2020-07-04 NOTE — Assessment & Plan Note (Signed)
Ongoing for several weeks H/o b/l carpal tunnel syndrome, but now with persistent numbness of L index finger and intermittent paraesthesias up L arm No neck TTP  Discussed that she likely needs NCS/EMG to determine location of impingement Will start gabapentin qhs for symptom management Referral to Neuro for further evaluation (of note, patient's father has Parkinson's and she worries about neurologic issues)

## 2020-07-06 IMAGING — MG DIGITAL DIAGNOSTIC BILATERAL MAMMOGRAM WITH TOMO AND CAD
6 of 10 series · 6 of 30 positions shown · non-contrast
Comparison: None.

CLINICAL DATA: Patient describes a painful lump within the lower
RIGHT breast, 4 o'clock position.

EXAM:
DIGITAL DIAGNOSTIC BILATERAL MAMMOGRAM WITH CAD AND TOMO
ULTRASOUND RIGHT BREAST

[R MLO synth-2D (1 of 2)]
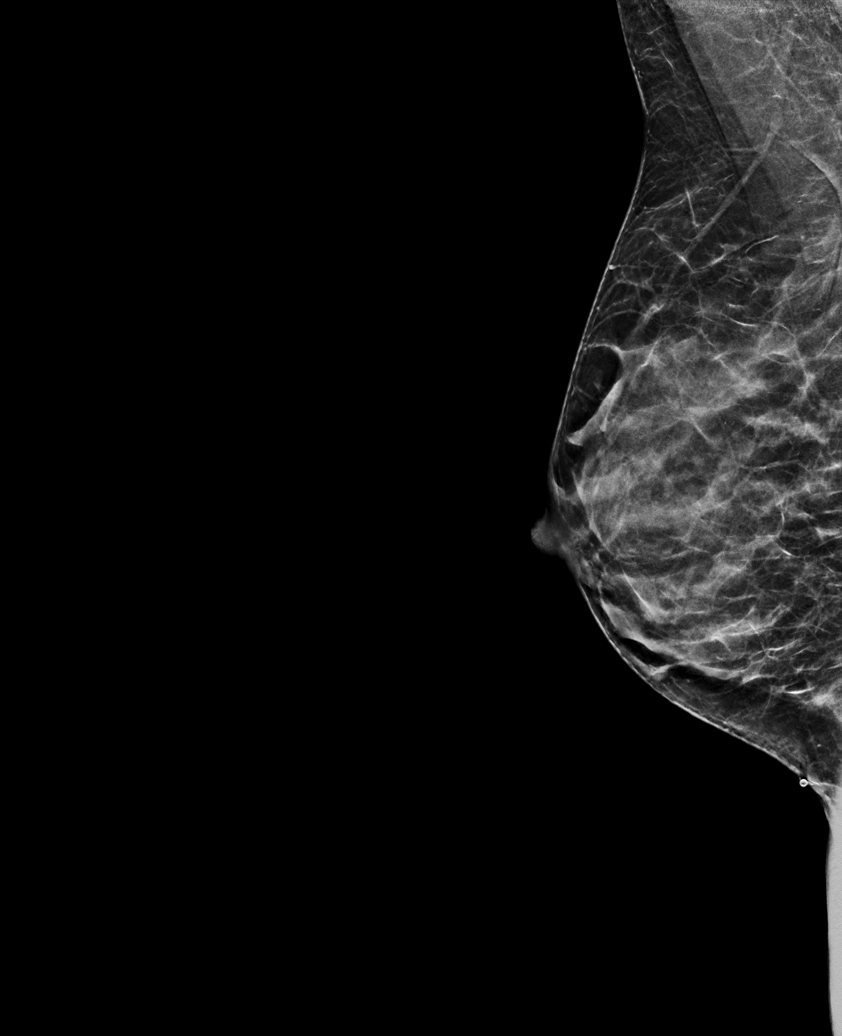

[R CC synth-2D]
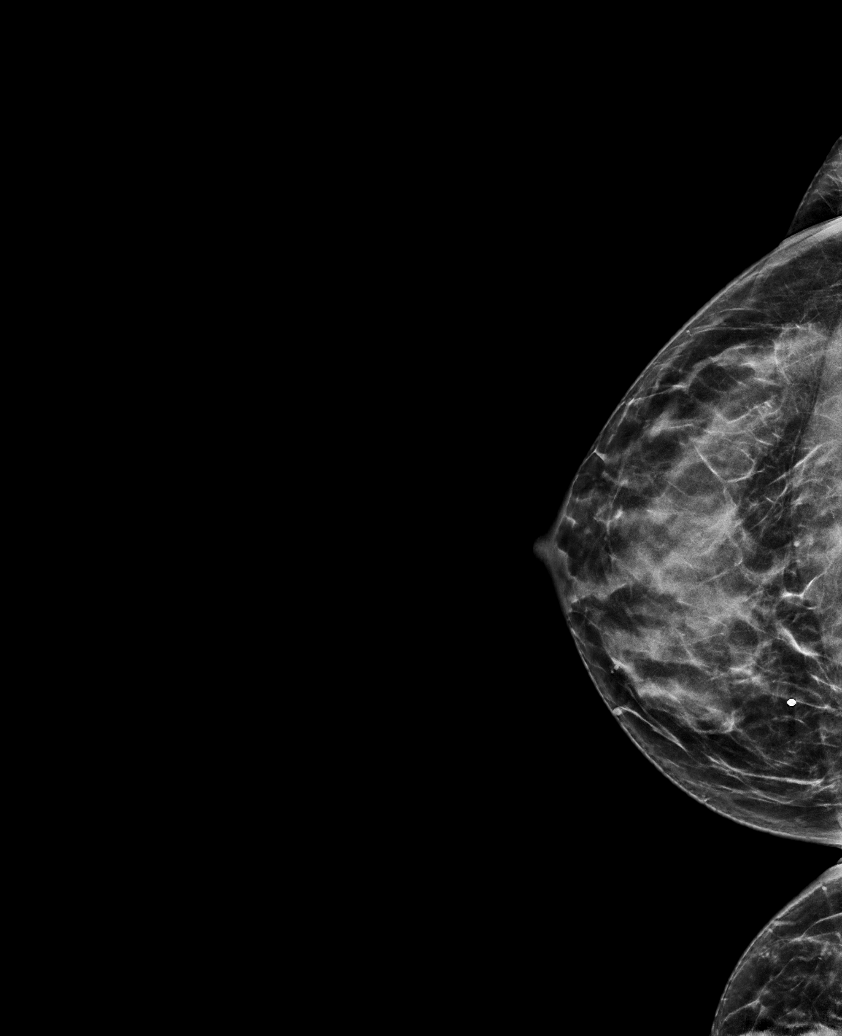

[L MLO synth-2D]
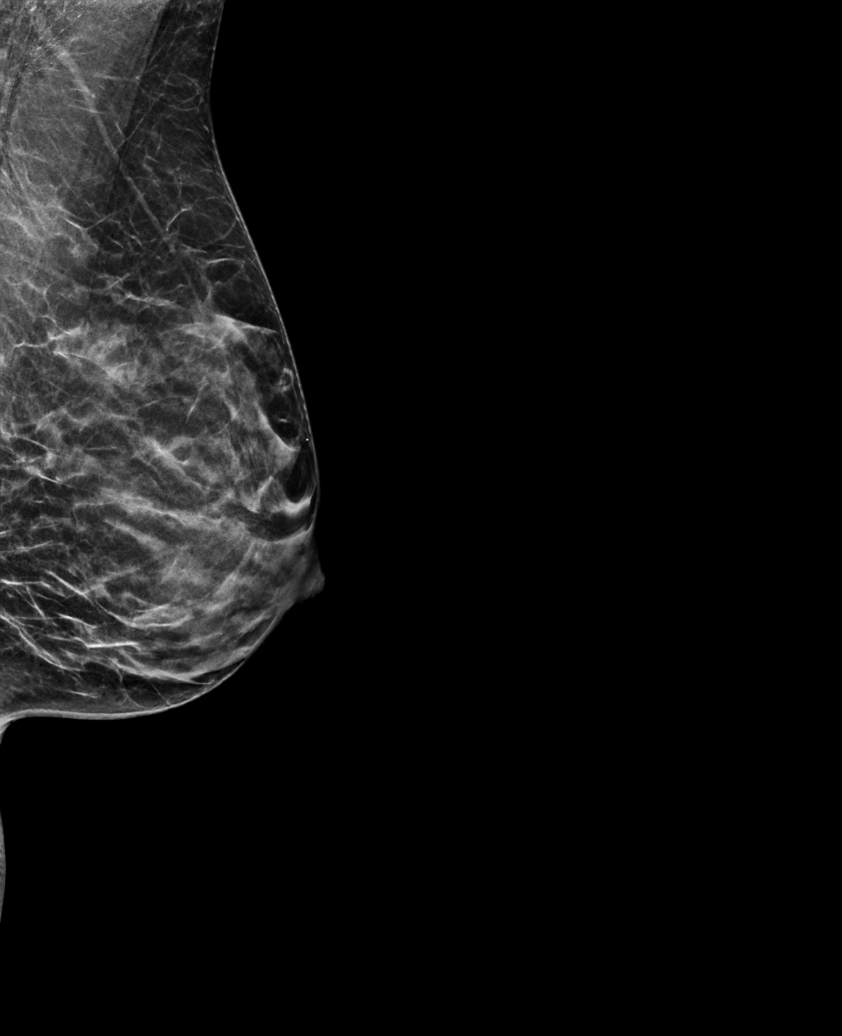

[L CC synth-2D]
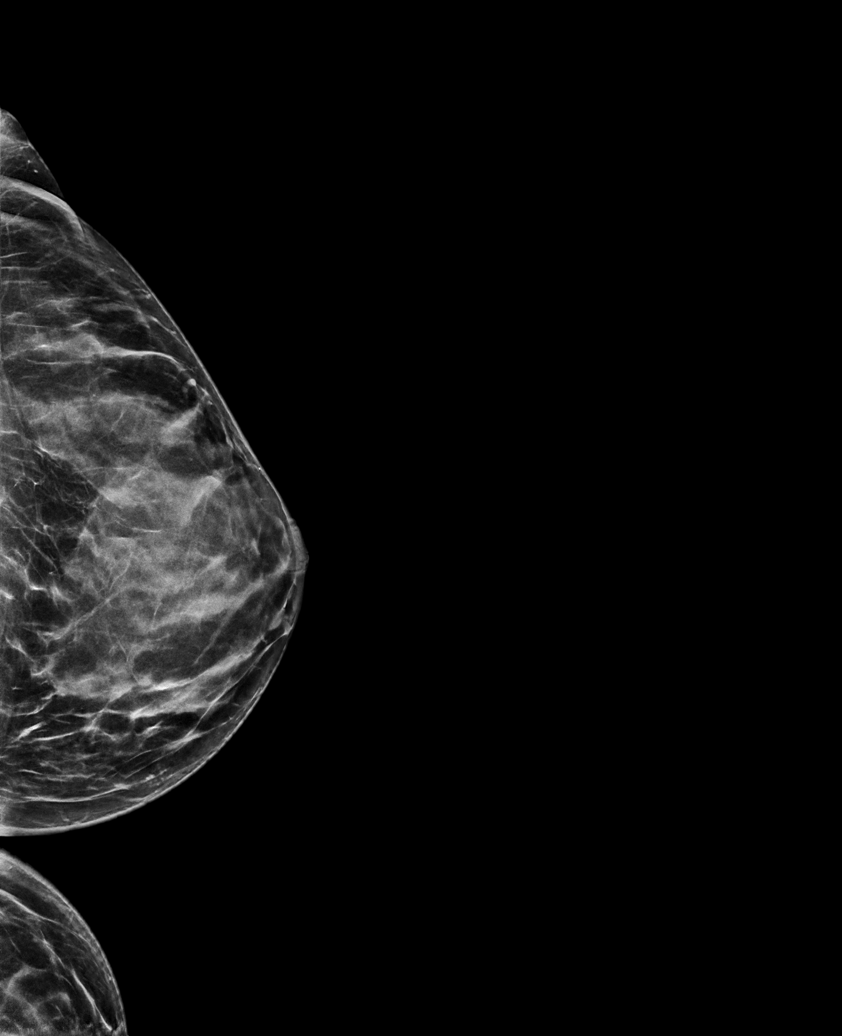

[R MLO synth-2D (2 of 2)]
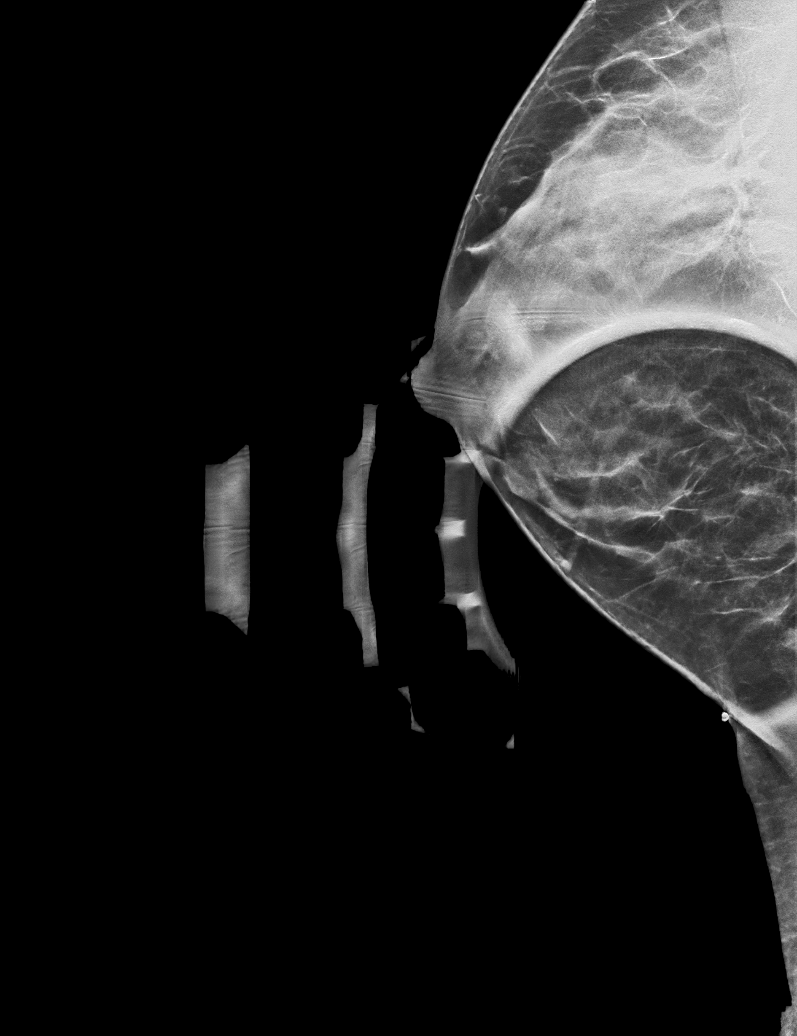

[L CC tomo · tomo slice 26/51.0]
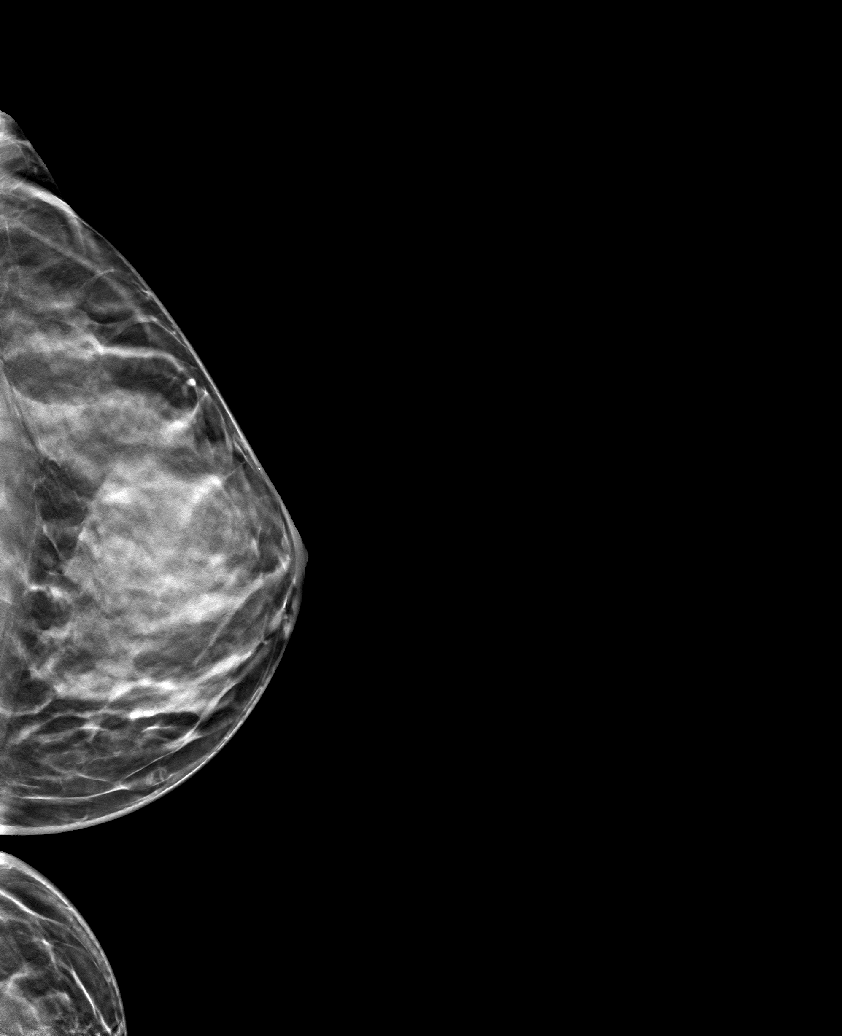

[6 of 30 positions shown; findings below may reference images not displayed]

Previous outside exams cannot be obtained.

ACR Breast Density Category c: The breast tissue is heterogeneously
dense, which may obscure small masses.
FINDINGS: There are no dominant masses, suspicious calcifications or secondary
signs of malignancy within the RIGHT breast. Specifically, there is
no mammographic abnormality within the lower inner quadrant of the
RIGHT breast corresponding to the area of clinical concern, with
overlying skin marker in place.

Mammographic images were processed with CAD.

Targeted ultrasound is performed, evaluating the lower inner
quadrant of the RIGHT breast as directed by the patient, showing
only normal fibroglandular tissues and fat lobules. No solid or
cystic mass.
IMPRESSION: 1. No evidence of malignancy within either breast. Specifically, no
evidence of malignancy within the lower inner quadrant of the RIGHT
breast, corresponding to the area of clinical concern.
2. As there are underlying ribs at the site of patient's pain,
consider costochondritis as a possible etiology.

RECOMMENDATION:
1. Screening mammogram at age 40 unless there are persistent or
intervening clinical concerns. (Code:[id number]-T-6DG)
2. The patient was instructed to return sooner if the area that she
feels becomes larger and/or firmer to palpation, or if a new
palpable abnormality is identified in either breast.
3. Clinical follow-up recommended for the painful area of concern in
the RIGHT breast. Any further workup should be based on clinical
grounds.

I have discussed the findings and recommendations with the patient.
Results were also provided in writing at the conclusion of the
visit. If applicable, a reminder letter will be sent to the patient
regarding the next appointment.

BI-RADS CATEGORY  1: Negative.

## 2020-08-30 ENCOUNTER — Other Ambulatory Visit: Payer: Self-pay | Admitting: Physician Assistant

## 2020-08-30 DIAGNOSIS — B001 Herpesviral vesicular dermatitis: Secondary | ICD-10-CM

## 2020-09-23 ENCOUNTER — Other Ambulatory Visit: Payer: Self-pay | Admitting: Family Medicine

## 2020-09-23 DIAGNOSIS — B001 Herpesviral vesicular dermatitis: Secondary | ICD-10-CM

## 2020-09-24 DIAGNOSIS — R413 Other amnesia: Secondary | ICD-10-CM | POA: Diagnosis not present

## 2020-09-24 DIAGNOSIS — E559 Vitamin D deficiency, unspecified: Secondary | ICD-10-CM | POA: Diagnosis not present

## 2020-09-24 DIAGNOSIS — A539 Syphilis, unspecified: Secondary | ICD-10-CM | POA: Diagnosis not present

## 2020-09-24 DIAGNOSIS — E538 Deficiency of other specified B group vitamins: Secondary | ICD-10-CM | POA: Diagnosis not present

## 2020-09-24 DIAGNOSIS — G5603 Carpal tunnel syndrome, bilateral upper limbs: Secondary | ICD-10-CM | POA: Diagnosis not present

## 2020-09-24 DIAGNOSIS — E519 Thiamine deficiency, unspecified: Secondary | ICD-10-CM | POA: Diagnosis not present

## 2020-09-24 LAB — VITAMIN B12: Vitamin B-12: <50

## 2020-09-24 LAB — BASIC METABOLIC PANEL
BUN: 13 (ref 4–21)
CO2: 29 — AB (ref 13–22)
Chloride: 106 (ref 99–108)
Creatinine: 0.8 (ref 0.5–1.1)
Glucose: 79
Potassium: 4.3 (ref 3.4–5.3)
Sodium: 139 (ref 137–147)

## 2020-09-24 LAB — CBC: RBC: 4.46 (ref 3.87–5.11)

## 2020-09-24 LAB — COMPREHENSIVE METABOLIC PANEL
Albumin: 4.3 (ref 3.5–5.0)
Calcium: 8.9 (ref 8.7–10.7)

## 2020-09-24 LAB — CBC AND DIFFERENTIAL
HCT: 42 (ref 36–46)
Hemoglobin: 14.1 (ref 12.0–16.0)
Neutrophils Absolute: 5.67
Platelets: 218 (ref 150–399)
WBC: 7.8

## 2020-09-24 LAB — HEPATIC FUNCTION PANEL
ALT: 14 (ref 7–35)
AST: 13 (ref 13–35)
Alkaline Phosphatase: 44 (ref 25–125)
Bilirubin, Total: 0.5

## 2020-09-24 LAB — TSH: TSH: 1.5 (ref 0.41–5.90)

## 2020-09-24 MED ORDER — VALACYCLOVIR HCL 500 MG PO TABS: 500.0000 mg | ORAL_TABLET | Freq: Two times a day (BID) | ORAL | 0 refills | Status: DC

## 2020-09-26 ENCOUNTER — Other Ambulatory Visit: Payer: Self-pay | Admitting: Neurology

## 2020-09-26 ENCOUNTER — Encounter: Payer: BC Managed Care – PPO | Admitting: Physician Assistant

## 2020-09-26 DIAGNOSIS — R413 Other amnesia: Secondary | ICD-10-CM

## 2020-10-03 ENCOUNTER — Ambulatory Visit (INDEPENDENT_AMBULATORY_CARE_PROVIDER_SITE_OTHER): Payer: BC Managed Care – PPO | Admitting: Family Medicine

## 2020-10-03 ENCOUNTER — Encounter: Payer: Self-pay | Admitting: Family Medicine

## 2020-10-03 ENCOUNTER — Other Ambulatory Visit: Payer: Self-pay

## 2020-10-03 VITALS — BP 128/89 | HR 89 | Temp 99.0°F | Resp 16 | Ht 65.0 in | Wt 148.3 lb

## 2020-10-03 DIAGNOSIS — Z1231 Encounter for screening mammogram for malignant neoplasm of breast: Secondary | ICD-10-CM | POA: Diagnosis not present

## 2020-10-03 DIAGNOSIS — Z0000 Encounter for general adult medical examination without abnormal findings: Secondary | ICD-10-CM | POA: Diagnosis not present

## 2020-10-03 NOTE — Progress Notes (Signed)
Complete physical exam   Patient: Pamela Daniels   DOB: 1981/01/02   40 y.o. Female  MRN: [id number] Visit Date: 10/03/2020  Today's healthcare provider: Shirlee Latch, MD   Chief Complaint  Patient presents with  . Annual Exam   Subjective    Pamela Daniels is a 40 y.o. female who presents today for a complete physical exam.  She reports consuming a general diet. Home exercise routine includes walking 5 hrs per week. She generally feels fairly well. She reports sleeping fairly well. She does not have additional problems to discuss today.  HPI     Tremors She has had her neurologist appointment 2 weeks ago for her tremors. She got her results from her MRI on Monday. She requested about over the counter vitamin D and B12 supplements due to fatigue.   Mammogram In her past mammograms benign lumps were found.   Lifestyle She is in the process of moving to [place] in June due to her husbands job as a Regulatory affairs officer.    09/27/19 Pap/HPV-Negative 04/08/18 Mammogram-BI-RADS 1   Past Medical History:  Diagnosis Date  . Allergy   . Hypertension    History reviewed. No pertinent surgical history. Social History   Socioeconomic History  . Marital status: Married    Spouse name: Not on file  . Number of children: Not on file  . Years of education: Not on file  . Highest education level: Not on file  Occupational History  . Not on file  Tobacco Use  . Smoking status: Never Smoker  . Smokeless tobacco: Never Used  Vaping Use  . Vaping Use: Never used  Substance and Sexual Activity  . Alcohol use: Not Currently  . Drug use: Not Currently  . Sexual activity: Yes    Birth control/protection: Pill  Other Topics Concern  . Not on file  Social History Narrative  . Not on file   Social Determinants of Health   Financial Resource Strain: Not on file  Food Insecurity: Not on file  Transportation Needs: Not on file  Physical Activity: Not on file   Stress: Not on file  Social Connections: Not on file  Intimate Partner Violence: Not on file   Family Status  Relation Name Status  . Mother  Alive  . Father  Alive  . [employer]  . MGM  Alive   Family History  Problem Relation Age of Onset  . Glaucoma Father   . Sleep apnea Father   . Skin cancer Father   . Depression Father   . Breast cancer Paternal Aunt        50s/60s  . Breast cancer Maternal Grandmother        late yrs   No Known Allergies  Patient Care Team: Erasmo Downer, MD as PCP - General (Family Medicine)   Medications: Outpatient Medications Prior to Visit  Medication Sig  . baclofen (LIORESAL) 10 MG tablet Take by mouth.  . cetirizine (ZYRTEC) 10 MG tablet Take 10 mg by mouth daily.  Marland Kitchen labetalol (NORMODYNE) 100 MG tablet Take 1 tablet (100 mg total) by mouth 2 (two) times daily.  Marland Kitchen levonorgestrel-ethinyl estradiol (SEASONALE) 0.15-0.03 MG tablet Take 1 tablet by mouth daily.  . valACYclovir (VALTREX) 500 MG tablet Take 1 tablet (500 mg total) by mouth 2 (two) times daily.  . [DISCONTINUED] gabapentin (NEURONTIN) 300 MG capsule Take 1 capsule (300 mg total) by mouth at bedtime. (Patient not taking:  Reported on 10/03/2020)   No facility-administered medications prior to visit.    Review of Systems  Constitutional: Positive for fatigue. Negative for chills and fever.  HENT: Negative for ear pain, rhinorrhea, sinus pressure, sinus pain and sore throat.   Eyes: Negative for pain.  Respiratory: Negative for choking, chest tightness, shortness of breath and wheezing.   Cardiovascular: Negative for chest pain, palpitations and leg swelling.  Gastrointestinal: Negative for abdominal pain, blood in stool, constipation, diarrhea, nausea and vomiting.  Genitourinary: Negative for dysuria, flank pain, frequency, hematuria, pelvic pain, urgency and vaginal pain.  Musculoskeletal: Positive for back pain and neck pain. Negative for myalgias and neck stiffness.   Neurological: Positive for tremors. Negative for dizziness, syncope, weakness, light-headedness, numbness and headaches.    Last CBC Lab Results  Component Value Date   WBC 6.3 09/27/2019   HGB 14.1 09/27/2019   HCT 41.2 09/27/2019   MCV 93 09/27/2019   MCH 31.7 09/27/2019   RDW 12.5 09/27/2019   PLT 206 09/27/2019   Last metabolic panel Lab Results  Component Value Date   GLUCOSE 86 09/27/2019   NA 141 09/27/2019   K 4.2 09/27/2019   CL 105 09/27/2019   CO2 21 09/27/2019   BUN 10 09/27/2019   CREATININE 0.80 09/27/2019   GFRNONAA 93 09/27/2019   GFRAA 107 09/27/2019   CALCIUM 9.2 09/27/2019   PROT 6.6 09/27/2019   ALBUMIN 4.6 09/27/2019   LABGLOB 2.0 09/27/2019   AGRATIO 2.3 (H) 09/27/2019   BILITOT 0.5 09/27/2019   ALKPHOS 53 09/27/2019   AST 15 09/27/2019   ALT 14 09/27/2019   Last lipids Lab Results  Component Value Date   CHOL 174 09/27/2019   HDL 65 09/27/2019   LDLCALC 95 09/27/2019   TRIG 77 09/27/2019   CHOLHDL 2.7 09/27/2019   Last thyroid functions Lab Results  Component Value Date   TSH 1.430 09/27/2019      Objective    BP 128/89 (BP Location: Right Arm, Patient Position: Sitting, Cuff Size: Normal)   Pulse 89   Temp 99 F (37.2 C) (Oral)   Resp 16   Ht 5\' 5"  (1.651 m)   Wt 148 lb 4.8 oz (67.3 kg)   SpO2 98%   BMI 24.68 kg/m  BP Readings from Last 3 Encounters:  10/03/20 128/89  07/04/20 116/82  06/19/20 120/80   Wt Readings from Last 3 Encounters:  10/03/20 148 lb 4.8 oz (67.3 kg)  07/04/20 148 lb (67.1 kg)  06/19/20 148 lb (67.1 kg)      Physical Exam Vitals reviewed.  Constitutional:      General: She is not in acute distress.    Appearance: Normal appearance. She is well-developed. She is not diaphoretic.  HENT:     Head: Normocephalic and atraumatic.     Right Ear: Tympanic membrane, ear canal and external ear normal.     Left Ear: Tympanic membrane, ear canal and external ear normal.     Nose: Nose normal.      Mouth/Throat:     Mouth: Mucous membranes are moist.     Pharynx: Oropharynx is clear. No oropharyngeal exudate.  Eyes:     General: No scleral icterus.    Conjunctiva/sclera: Conjunctivae normal.     Pupils: Pupils are equal, round, and reactive to light.  Neck:     Thyroid: No thyromegaly.  Cardiovascular:     Rate and Rhythm: Normal rate and regular rhythm.     Pulses: Normal pulses.  Heart sounds: Normal heart sounds. No murmur heard.   Pulmonary:     Effort: Pulmonary effort is normal. No respiratory distress.     Breath sounds: Normal breath sounds. No wheezing or rales.  Abdominal:     General: There is no distension.     Palpations: Abdomen is soft.     Tenderness: There is no abdominal tenderness.  Musculoskeletal:        General: No deformity.     Cervical back: Neck supple.     Right lower leg: No edema.     Left lower leg: No edema.  Lymphadenopathy:     Cervical: No cervical adenopathy.  Skin:    General: Skin is warm and dry.     Findings: No rash.  Neurological:     Mental Status: She is alert and oriented to person, place, and time. Mental status is at baseline.     Sensory: No sensory deficit.     Motor: No weakness.     Gait: Gait normal.  Psychiatric:        Mood and Affect: Mood normal.        Behavior: Behavior normal.        Thought Content: Thought content normal.       Last depression screening scores PHQ 2/9 Scores 10/03/2020 07/04/2020 04/30/2020  PHQ - 2 Score 0 0 2  PHQ- 9 Score 2 2 6    Last fall risk screening Fall Risk  10/03/2020  Falls in the past year? 0  Number falls in past yr: 0  Injury with Fall? 0  Risk for fall due to : No Fall Risks  Follow up Falls evaluation completed   Last Audit-C alcohol use screening Alcohol Use Disorder Test (AUDIT) 10/03/2020  1. How often do you have a drink containing alcohol? 0  2. How many drinks containing alcohol do you have on a typical day when you are drinking? 0  3. How often do you  have six or more drinks on one occasion? 0  AUDIT-C Score 0  Alcohol Brief Interventions/Follow-up -   A score of 3 or more in women, and 4 or more in men indicates increased risk for alcohol abuse, EXCEPT if all of the points are from question 1   No results found for any visits on 10/03/20.  Assessment & Plan     Problem List Items Addressed This Visit   None   Visit Diagnoses    Annual physical exam    -  Primary   Breast cancer screening by mammogram       Relevant Orders   MM 3D SCREEN BREAST BILATERAL     Routine Health Maintenance and Physical Exam  Exercise Activities and Dietary recommendations Goals   None     Immunization History  Administered Date(s) Administered  . Influenza Split 04/11/2008  . Influenza,inj,Quad PF,6+ Mos 03/11/2018  . PFIZER Comirnaty(Gray Top)Covid-19 Tri-Sucrose Vaccine 06/14/2020  . PFIZER(Purple Top)SARS-COV-2 Vaccination 09/11/2019, 10/02/2019  . Tdap 09/21/2009, 03/11/2018    Health Maintenance  Topic Date Due  . Hepatitis C Screening  Never done  . COVID-19 Vaccine (4 - Booster for Pfizer series) 12/12/2020  . INFLUENZA VACCINE  12/30/2020  . PAP SMEAR-Modifier  09/26/2024  . TETANUS/TDAP  03/11/2028  . HIV Screening  Completed  . HPV VACCINES  Aged Out    Discussed health benefits of physical activity, and encouraged her to engage in regular exercise appropriate for her age and condition.    Return  in about 1 year (around 10/03/2021) for CPE.      I,Essence Turner,acting as a Neurosurgeon for Shirlee Latch, MD.,have documented all relevant documentation on the behalf of Shirlee Latch, MD,as directed by  Shirlee Latch, MD while in the presence of Shirlee Latch, MD.  I, Shirlee Latch, MD, have reviewed all documentation for this visit. The documentation on 10/03/20 for the exam, diagnosis, procedures, and orders are all accurate and complete.   Tyshay Adee, Marzella Schlein, MD, MPH [facility]

## 2020-10-03 NOTE — Patient Instructions (Signed)
Preventive Care 84-40 Years Old, Female Preventive care refers to lifestyle choices and visits with your health care provider that can promote health and wellness. This includes:  A yearly physical exam. This is also called an annual wellness visit.  Regular dental and eye exams.  Immunizations.  Screening for certain conditions.  Healthy lifestyle choices, such as: ? Eating a healthy diet. ? Getting regular exercise. ? Not using drugs or products that contain nicotine and tobacco. ? Limiting alcohol use. What can I expect for my preventive care visit? Physical exam Your health care provider will check your:  Height and weight. These may be used to calculate your BMI (body mass index). BMI is a measurement that tells if you are at a healthy weight.  Heart rate and blood pressure.  Body temperature.  Skin for abnormal spots. Counseling Your health care provider may ask you questions about your:  Past medical problems.  Family's medical history.  Alcohol, tobacco, and drug use.  Emotional well-being.  Home life and relationship well-being.  Sexual activity.  Diet, exercise, and sleep habits.  Work and work Statistician.  Access to firearms.  Method of birth control.  Menstrual cycle.  Pregnancy history. What immunizations do I need? Vaccines are usually given at various ages, according to a schedule. Your health care provider will recommend vaccines for you based on your age, medical history, and lifestyle or other factors, such as travel or where you work.   What tests do I need? Blood tests  Lipid and cholesterol levels. These may be checked every 5 years, or more often if you are over 3 years old.  Hepatitis C test.  Hepatitis B test. Screening  Lung cancer screening. You may have this screening every year starting at age 73 if you have a 30-pack-year history of smoking and currently smoke or have quit within the past 15 years.  Colorectal cancer  screening. ? All adults should have this screening starting at age 52 and continuing until age 17. ? Your health care provider may recommend screening at age 49 if you are at increased risk. ? You will have tests every 1-10 years, depending on your results and the type of screening test.  Diabetes screening. ? This is done by checking your blood sugar (glucose) after you have not eaten for a while (fasting). ? You may have this done every 1-3 years.  Mammogram. ? This may be done every 1-2 years. ? Talk with your health care provider about when you should start having regular mammograms. This may depend on whether you have a family history of breast cancer.  BRCA-related cancer screening. This may be done if you have a family history of breast, ovarian, tubal, or peritoneal cancers.  Pelvic exam and Pap test. ? This may be done every 3 years starting at age 10. ? Starting at age 11, this may be done every 5 years if you have a Pap test in combination with an HPV test. Other tests  STD (sexually transmitted disease) testing, if you are at risk.  Bone density scan. This is done to screen for osteoporosis. You may have this scan if you are at high risk for osteoporosis. Talk with your health care provider about your test results, treatment options, and if necessary, the need for more tests. Follow these instructions at home: Eating and drinking  Eat a diet that includes fresh fruits and vegetables, whole grains, lean protein, and low-fat dairy products.  Take vitamin and mineral supplements  as recommended by your health care provider.  Do not drink alcohol if: ? Your health care provider tells you not to drink. ? You are pregnant, may be pregnant, or are planning to become pregnant.  If you drink alcohol: ? Limit how much you have to 0-1 drink a day. ? Be aware of how much alcohol is in your drink. In the [place] one drink equals one 12 oz bottle of beer (355 mL), one 5 oz glass of  wine (148 mL), or one 1 oz glass of hard liquor (44 mL).   Lifestyle  Take daily care of your teeth and gums. Brush your teeth every morning and night with fluoride toothpaste. Floss one time each day.  Stay active. Exercise for at least 30 minutes 5 or more days each week.  Do not use any products that contain nicotine or tobacco, such as cigarettes, e-cigarettes, and chewing tobacco. If you need help quitting, ask your health care provider.  Do not use drugs.  If you are sexually active, practice safe sex. Use a condom or other form of protection to prevent STIs (sexually transmitted infections).  If you do not wish to become pregnant, use a form of birth control. If you plan to become pregnant, see your health care provider for a prepregnancy visit.  If told by your health care provider, take low-dose aspirin daily starting at age 7.  Find healthy ways to cope with stress, such as: ? Meditation, yoga, or listening to music. ? Journaling. ? Talking to a trusted person. ? Spending time with friends and family. Safety  Always wear your seat belt while driving or riding in a vehicle.  Do not drive: ? If you have been drinking alcohol. Do not ride with someone who has been drinking. ? When you are tired or distracted. ? While texting.  Wear a helmet and other protective equipment during sports activities.  If you have firearms in your house, make sure you follow all gun safety procedures. What's next?  Visit your health care provider once a year for an annual wellness visit.  Ask your health care provider how often you should have your eyes and teeth checked.  Stay up to date on all vaccines. This information is not intended to replace advice given to you by your health care provider. Make sure you discuss any questions you have with your health care provider. Document Revised: 02/20/2020 Document Reviewed: 01/27/2018 Elsevier Patient Education  2021 [employer].

## 2020-10-07 ENCOUNTER — Ambulatory Visit
Admission: RE | Admit: 2020-10-07 | Discharge: 2020-10-07 | Disposition: A | Discharge status: Home / Self Care | Payer: BC Managed Care – PPO | Source: Ambulatory Visit | Attending: Family Medicine | Admitting: Family Medicine

## 2020-10-07 ENCOUNTER — Ambulatory Visit: Payer: BC Managed Care – PPO

## 2020-10-07 ENCOUNTER — Other Ambulatory Visit: Payer: Self-pay

## 2020-10-07 DIAGNOSIS — Z1231 Encounter for screening mammogram for malignant neoplasm of breast: Secondary | ICD-10-CM | POA: Insufficient documentation

## 2020-10-08 DIAGNOSIS — E538 Deficiency of other specified B group vitamins: Secondary | ICD-10-CM | POA: Diagnosis not present

## 2020-10-15 ENCOUNTER — Ambulatory Visit: Payer: BC Managed Care – PPO

## 2020-10-15 DIAGNOSIS — E538 Deficiency of other specified B group vitamins: Secondary | ICD-10-CM | POA: Diagnosis not present

## 2020-10-22 DIAGNOSIS — E538 Deficiency of other specified B group vitamins: Secondary | ICD-10-CM | POA: Diagnosis not present

## 2020-10-29 DIAGNOSIS — E538 Deficiency of other specified B group vitamins: Secondary | ICD-10-CM | POA: Diagnosis not present

## 2020-10-31 ENCOUNTER — Ambulatory Visit
Admission: RE | Admit: 2020-10-31 | Discharge: 2020-10-31 | Disposition: A | Discharge status: Home / Self Care | Payer: BC Managed Care – PPO | Source: Ambulatory Visit | Attending: Neurology | Admitting: Neurology

## 2020-10-31 ENCOUNTER — Other Ambulatory Visit: Payer: Self-pay

## 2020-10-31 DIAGNOSIS — R413 Other amnesia: Secondary | ICD-10-CM | POA: Insufficient documentation

## 2020-10-31 DIAGNOSIS — R200 Anesthesia of skin: Secondary | ICD-10-CM | POA: Diagnosis not present

## 2020-10-31 DIAGNOSIS — R519 Headache, unspecified: Secondary | ICD-10-CM | POA: Diagnosis not present

## 2020-10-31 MED ORDER — GADOBUTROL 1 MMOL/ML IV SOLN
6.0000 mL | Freq: Once | INTRAVENOUS | Status: AC | PRN
  Administered 2020-10-31: 6 mL via INTRAVENOUS

## 2020-11-04 ENCOUNTER — Telehealth: Payer: Self-pay | Admitting: Family Medicine

## 2020-11-04 DIAGNOSIS — I10 Essential (primary) hypertension: Secondary | ICD-10-CM

## 2020-11-04 MED ORDER — LABETALOL HCL 100 MG PO TABS: 100.0000 mg | ORAL_TABLET | Freq: Two times a day (BID) | ORAL | 1 refills | Status: DC

## 2020-11-04 NOTE — Telephone Encounter (Signed)
[facility] faxed refill request for the following medications:  labetalol (NORMODYNE) 100 MG tablet  Last Rx: 04/30/20 Qty: 180 Refills: 1 LOV: 10/03/20 Please advise. Thanks TNP

## 2020-11-06 ENCOUNTER — Telehealth: Payer: Self-pay | Admitting: Family Medicine

## 2020-11-06 DIAGNOSIS — Z3041 Encounter for surveillance of contraceptive pills: Secondary | ICD-10-CM

## 2020-11-06 MED ORDER — LEVONORGEST-ETH ESTRAD 91-DAY 0.15-0.03 MG PO TABS: 1.0000 | ORAL_TABLET | Freq: Every day | ORAL | 3 refills | Status: DC

## 2020-11-06 NOTE — Telephone Encounter (Signed)
[facility] faxed refill request for the following medications:  levonorgestrel-ethinyl estradiol (SEASONALE) 0.15-0.03 MG tablet  Last Rx: 08/16/19 Qty: 91 Refills: 3 LOV: 10/03/20 Please advise. Thanks TNP

## 2021-01-27 ENCOUNTER — Telehealth: Payer: Self-pay | Admitting: Family Medicine

## 2021-01-27 DIAGNOSIS — Z3041 Encounter for surveillance of contraceptive pills: Secondary | ICD-10-CM

## 2021-01-27 MED ORDER — LEVONORGEST-ETH ESTRAD 91-DAY 0.15-0.03 MG PO TABS: 1.0000 | ORAL_TABLET | Freq: Every day | ORAL | 3 refills | Status: DC

## 2021-01-27 NOTE — Telephone Encounter (Signed)
[facility] faxed refill request for the following medications:  levonorgestrel-ethinyl estradiol (SEASONALE) 0.15-0.03 MG tablet    Please advise.

## 2021-01-29 ENCOUNTER — Telehealth: Payer: Self-pay | Admitting: Family Medicine

## 2021-01-29 NOTE — Telephone Encounter (Signed)
Too soon for refill request. Medication was last filled by Dr. Sherrie Mustache on 11/04/20 for qty of 180 which should last patient 3 months, this prescription also had 1 additional refill.Request will be denied at this time. KW

## 2021-01-29 NOTE — Telephone Encounter (Signed)
[facility] faxed refill request for the following medications:     Please advise  labetalol (NORMODYNE) 100 MG tablet

## 2021-02-03 ENCOUNTER — Other Ambulatory Visit: Payer: Self-pay | Admitting: Family Medicine

## 2021-02-03 DIAGNOSIS — B001 Herpesviral vesicular dermatitis: Secondary | ICD-10-CM

## 2021-02-10 ENCOUNTER — Ambulatory Visit
Admit: 2021-02-10 | Discharge: 2021-02-10 | Payer: BLUE CROSS/BLUE SHIELD | Attending: Family Medicine | Primary: Family Medicine

## 2021-02-10 DIAGNOSIS — E538 Deficiency of other specified B group vitamins: Secondary | ICD-10-CM

## 2021-02-10 MED ORDER — LEVONORGEST-ETH ESTRAD 91-DAY 0.15-0.03 MG PO TABS
PACK | Freq: Every day | ORAL | 3 refills | Status: AC
Start: 2021-02-10 — End: 2022-04-17

## 2021-02-10 MED ORDER — VALACYCLOVIR HCL 500 MG PO TABS
500 MG | ORAL_TABLET | Freq: Two times a day (BID) | ORAL | 1 refills | Status: DC
Start: 2021-02-10 — End: 2021-10-24

## 2021-02-10 MED ORDER — LABETALOL HCL 200 MG PO TABS
200 MG | ORAL_TABLET | ORAL | 1 refills | Status: DC
Start: 2021-02-10 — End: 2021-10-24

## 2021-02-10 NOTE — Progress Notes (Signed)
Rhonda Kline   Date of Birth:  [birth year]    Date of Visit:  02/10/2021        No Known Allergies  Outpatient Medications Marked as Taking for the 02/10/21 encounter (Office Visit) with Mitchel Honour, MD   Medication Sig Dispense Refill    labetalol (NORMODYNE) 200 MG tablet TAKE 1 TABLET BY MOUTH TWICE A DAY 90 tablet 1    levonorgestrel-ethinyl estradiol (SEASONALE) 0.15-0.03 MG per tablet Take 1 tablet by mouth daily 1 packet 3    valACYclovir (VALTREX) 500 MG tablet Take 1 tablet by mouth 2 times daily 180 tablet 1         Vitals:    02/10/21 1140   BP: 110/80   Pulse: 80   Resp: 16   Temp: 98.7 ??F (37.1 ??C)   TempSrc: Tympanic   Weight: 152 lb 6.4 oz (69.1 kg)   Height: '5\' 5"'$  (1.651 m)     Body mass index is 25.36 kg/m??.     Wt Readings from Last 3 Encounters:   02/10/21 152 lb 6.4 oz (69.1 kg)     BP Readings from Last 3 Encounters:   02/10/21 110/80        Chief Complaint   Patient presents with    Established New Doctor       HPI    Rhonda Kline presents to establish care. she has the following concerns today:    HTN:  Taking labetalol '200mg'$  qhs which has been working well for her.  BP was in the 150-160's when moving this summer. Hasn't been checking it much the last few weeks.  No symptoms of low BP.     B12 Deficiency:  Started on injections prior to moving for low B12.  Labs done by neurology as pt was having hand numbness.  Not vegetarian. Taking ? Dose of oral B12 daily. Hx of taking injections through May 2022.     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 If she feels a cold sore coming on takes it BID for a short period of time.     Vit D deficiency:  takes 1000 units of Vit D daily.     History of skin cancer:  BCC or SCC.     HM: s/p normal pap smear with negative HPV 09/27/19.     SH:  01/2021:  Extended family is in [place] including her parents.  Works as an Futures trader.  Lives with husband (started new job recently as a Film/video editor for [employer]) and 2  daughters who are patients here.     Past Medical History:   Diagnosis Date    B12 deficiency     Cold sore     Primary hypertension     Vitamin D deficiency        History reviewed. No pertinent surgical history.    Social History     Socioeconomic History    Marital status: Married     Spouse name: Not on file    Number of children: Not on file    Years of education: Not on file    Highest education level: Not on file   Occupational History    Not on file   Tobacco Use    Smoking status: Never    Smokeless tobacco: Never   Substance and Sexual Activity    Alcohol use: Not Currently    Drug use: Never    Sexual  activity: Not on file   Other Topics Concern    Not on file   Social History Narrative    Not on file     Social Determinants of Health     Financial Resource Strain: Low Risk     Difficulty of Paying Living Expenses: Not hard at all   Food Insecurity: No Food Insecurity    Worried About [place] in the Last Year: Never true    [place] in the Last Year: Never true   Transportation Needs: No Transportation Needs    Lack of Transportation (Medical): No    Lack of Transportation (Non-Medical): No   Physical Activity: Not on file   Stress: Not on file   Social Connections: Not on file   Intimate Partner Violence: Not on file   Housing Stability: Not on file       Family History   Problem Relation Age of Onset    No Known Problems Mother     Cancer Father         skin cancer    Parkinson's Disease Father     No Known Problems Sister     No Known Problems Sister     No Known Problems Brother     Cancer Maternal Grandmother         breast cancer    Heart Attack Maternal Grandmother     Alzheimer's Disease Maternal Grandfather     Cancer Paternal Grandmother          Review of Systems  Complete review of systems negative except as documented in the HPI.    Physical Exam  Constitutional:       General: She is not in acute distress.     Appearance: She is well-developed.   HENT:      Head:  Normocephalic and atraumatic.      Right Ear: Tympanic membrane, ear canal and external ear normal. There is no impacted cerumen. Tympanic membrane is not injected or bulging.      Left Ear: Tympanic membrane, ear canal and external ear normal. There is no impacted cerumen. Tympanic membrane is not injected or bulging.      Nose: Nose normal.      Mouth/Throat:      Mouth: Mucous membranes are moist.      Pharynx: No oropharyngeal exudate or posterior oropharyngeal erythema.   Eyes:      General: Lids are normal.         Right eye: No discharge.         Left eye: No discharge.      Extraocular Movements: Extraocular movements intact.      Conjunctiva/sclera: Conjunctivae normal.      Pupils: Pupils are equal, round, and reactive to light.   Neck:      Thyroid: No thyromegaly.      Trachea: No tracheal deviation.   Cardiovascular:      Rate and Rhythm: Normal rate and regular rhythm.      Heart sounds: Normal heart sounds. No murmur heard.  Pulmonary:      Effort: Pulmonary effort is normal. No respiratory distress.      Breath sounds: Normal breath sounds.   Abdominal:      General: Bowel sounds are normal. There is no distension.      Palpations: Abdomen is soft.      Tenderness: There is no abdominal tenderness.   Musculoskeletal:  General: No swelling.      Cervical back: Normal range of motion and neck supple.      Comments: Normal gait, normal muscle tone   Lymphadenopathy:      Cervical: No cervical adenopathy.   Skin:     General: Skin is warm and dry.      Findings: No rash.   Neurological:      General: No focal deficit present.      Mental Status: She is alert and oriented to person, place, and time. Mental status is at baseline.      Cranial Nerves: No cranial nerve deficit.   Psychiatric:         Mood and Affect: Mood normal.         Behavior: Behavior normal.         Thought Content: Thought content normal.         Judgment: Judgment normal.         Assessment/Plan     1. B12 deficiency  Stable.  Recheck. On oral b12.   - Vitamin B12; Future  - Methylmalonic Acid, Serum; Future    2. Cold sore  Stable. Continue current regimen.   - valACYclovir (VALTREX) 500 MG tablet; Take 1 tablet by mouth 2 times daily  Dispense: 180 tablet; Refill: 1    3. Primary hypertension  Stable. Continue current regimen. Monitor and f/u if there are any issues.   - labetalol (NORMODYNE) 200 MG tablet; TAKE 1 TABLET BY MOUTH TWICE A DAY  Dispense: 90 tablet; Refill: 1    4. Vitamin D deficiency  Stable. Continue current regimen.   - Vitamin D 25 Hydroxy; Future    5. Healthcare maintenance  Annual physical exam done today. Counseled on preventative care and a healthy lifestlye.    - levonorgestrel-ethinyl estradiol (SEASONALE) 0.15-0.03 MG per tablet; Take 1 tablet by mouth daily  Dispense: 1 packet; Refill: 3  - Lipid Panel; Future  - Glucose, Fasting; Future  - Hepatitis C Antibody; Future    6. History of skin cancer  Stable. Continue current regimen. Referral for skin check.   - [place] - Moosbrugger, Raquel Sarna, MD, Dermatology, [facility]    Discussed medications with patient, who voiced understanding of their use and indications. All questions answered.    Return in about 1 year (around 02/10/2022) for Physical.       Documentation was done using voice recognition dragon software. Efforts were made to ensure accuracy; however, inadvertent, unintentional computerized transcription errors may be present.     --Mitchel Honour, MD on 02/10/2021    An electronic signature was used to authenticate this note.

## 2021-02-12 ENCOUNTER — Encounter

## 2021-02-13 LAB — LIPID PANEL
Cholesterol, Total: 206 mg/dL — ABNORMAL HIGH (ref 0–199)
HDL: 51 mg/dL (ref 40–60)
LDL Calculated: 125 mg/dL — ABNORMAL HIGH (ref <100)
Triglycerides: 152 mg/dL — ABNORMAL HIGH (ref 0–150)
VLDL Cholesterol Calculated: 30 mg/dL

## 2021-02-13 LAB — VITAMIN B12: Vitamin B-12: 313 pg/mL (ref 211–911)

## 2021-02-13 LAB — VITAMIN D 25 HYDROXY: Vit D, 25-Hydroxy: 64.6 ng/mL (ref >=30)

## 2021-02-13 LAB — GLUCOSE, FASTING: Glucose, Fasting: 79 mg/dL (ref 70–99)

## 2021-02-13 LAB — HEPATITIS C ANTIBODY: Hep C Ab Interp: NONREACTIVE

## 2021-02-16 LAB — METHYLMALONIC ACID, SERUM: Methylmalonic Acid: 0.1 umol/L (ref 0.00–0.40)

## 2021-06-10 ENCOUNTER — Encounter: Payer: BLUE CROSS/BLUE SHIELD | Attending: Family Medicine | Primary: Family Medicine

## 2021-06-20 ENCOUNTER — Encounter: Payer: BLUE CROSS/BLUE SHIELD | Attending: Family Medicine | Primary: Family Medicine

## 2021-06-20 ENCOUNTER — Encounter

## 2021-06-20 LAB — BASIC METABOLIC PANEL
Anion Gap: 11 (ref 3–16)
BUN: 12 mg/dL (ref 7–20)
CO2: 24 mmol/L (ref 21–32)
Calcium: 9 mg/dL (ref 8.3–10.6)
Chloride: 107 mmol/L (ref 99–110)
Creatinine: 0.7 mg/dL (ref 0.6–1.1)
Est, Glom Filt Rate: >60 (ref >60)
Glucose: 87 mg/dL (ref 70–99)
Potassium: 4.5 mmol/L (ref 3.5–5.1)
Sodium: 142 mmol/L (ref 136–145)

## 2021-06-20 LAB — CBC WITH AUTO DIFFERENTIAL
Basophils %: 0.7 %
Basophils Absolute: 0 10*3/uL (ref 0.0–0.2)
Eosinophils %: 2.5 %
Eosinophils Absolute: 0.2 10*3/uL (ref 0.0–0.6)
Hematocrit: 39.9 % (ref 36.0–48.0)
Hemoglobin: 13.1 g/dL (ref 12.0–16.0)
Lymphocytes %: 18.7 %
Lymphocytes Absolute: 1.2 10*3/uL (ref 1.0–5.1)
MCH: 30.8 pg (ref 26.0–34.0)
MCHC: 32.7 g/dL (ref 31.0–36.0)
MCV: 94 fL (ref 80.0–100.0)
MPV: 9.6 fL (ref 5.0–10.5)
Monocytes %: 4.5 %
Monocytes Absolute: 0.3 10*3/uL (ref 0.0–1.3)
Neutrophils %: 73.6 %
Neutrophils Absolute: 4.8 10*3/uL (ref 1.7–7.7)
Platelets: 173 10*3/uL (ref 135–450)
RBC: 4.24 M/uL (ref 4.00–5.20)
RDW: 12.8 % (ref 12.4–15.4)
WBC: 6.6 10*3/uL (ref 4.0–11.0)

## 2021-06-20 NOTE — Progress Notes (Signed)
Rhonda Kline   Date of Birth:  Feb 07, 1981    Date of Visit:  06/20/2021    No Known Allergies  Outpatient Medications Marked as Taking for the 06/20/21 encounter (Office Visit) with Mitchel Honour, MD   Medication Sig Dispense Refill    labetalol (NORMODYNE) 200 MG tablet TAKE 1 TABLET BY MOUTH TWICE A DAY 90 tablet 1    levonorgestrel-ethinyl estradiol (SEASONALE) 0.15-0.03 MG per tablet Take 1 tablet by mouth daily 1 packet 3    valACYclovir (VALTREX) 500 MG tablet Take 1 tablet by mouth 2 times daily 180 tablet 1         Vitals:    06/20/21 0939   BP: 112/76   Site: Left Upper Arm   Position: Sitting   Cuff Size: Medium Adult   Pulse: 93   SpO2: 99%   Weight: 150 lb (68 kg)   Height: 5\' 5"  (1.651 m)     Body mass index is 24.96 kg/m??.     Wt Readings from Last 3 Encounters:   06/20/21 150 lb (68 kg)   02/10/21 152 lb 6.4 oz (69.1 kg)     BP Readings from Last 3 Encounters:   06/20/21 112/76   02/10/21 110/80        Chief Complaint   Patient presents with    Other     Heavy bleeding 06/01/2021           Assessment/Plan     1. Vaginal bleeding  Resolved.  Discussed history sounds like a miscarriage although would be unusual given that she is on OCP's and husband s/p vasectomy. Recommended he see urology to ensure that vasectomy is still effective.  Labs. [place]. Follow up if recurs.    - [place] NON OB TRANSVAGINAL; Future  - CBC with Auto Differential; Future  - TSH; Future  - HCG, QUANTITATIVE, PREGNANCY; Future  - Basic Metabolic Panel; Future    Discussed medications with patient, who voiced understanding of their use and indications. All questions answered.    No follow-ups on file.         HPI    Episode of bleeding:  06/01/21 had a large amount of bleeding and cramping. Took 2-3 hours then it completely stopped.  Skipped her OCP 06/01/21 and held it for 7 days.  2 days later had a normal period.  Resumed her regular OCP's.  Husband is s/p a vasectomy about 10 years ago. On this OCP for years. Rarely has spotting  between cycles. No hx of similar issue in the past. Feeling good now with no issues.     HTN:  Taking labetalol 200mg  qhs which has been working well for her.  BP was in the 150-160's when moving this summer. Hasn't been checking it much the last few weeks.  No symptoms of low BP.      B12 Deficiency:  Started on injections prior to moving for low B12.  Labs done by neurology as pt was having hand numbness.  Not vegetarian. Taking ? Dose of oral B12 daily. Hx of taking injections through May 2022.      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 If she feels a cold sore coming on takes it BID for a short period of time.      Vit D deficiency:  takes 1000 units of Vit D daily.      History of skin cancer:  BCC or SCC.  HM: s/p normal pap smear with negative HPV 09/27/19.      SH:  01/2021:  Extended family is in [place] including her parents.  Works as an Futures trader.  Lives with husband (started new job recently as a Film/video editor for [employer]) and 2 daughters who are patients here and go to [employer].       Social History     Socioeconomic History    Marital status: Married     Spouse name: Not on file    Number of children: Not on file    Years of education: Not on file    Highest education level: Not on file   Occupational History    Not on file   Tobacco Use    Smoking status: Never    Smokeless tobacco: Never   Substance and Sexual Activity    Alcohol use: Not Currently    Drug use: Never    Sexual activity: Not on file   Other Topics Concern    Not on file   Social History Narrative    Not on file     Social Determinants of Health     Financial Resource Strain: Low Risk     Difficulty of Paying Living Expenses: Not hard at all   Food Insecurity: No Food Insecurity    Worried About Running Out of Food in the Last Year: Never true    [place] in the Last Year: Never true   Transportation Needs: No Transportation Needs    Lack of Transportation (Medical): No     Lack of Transportation (Non-Medical): No   Physical Activity: Not on file   Stress: Not on file   Social Connections: Not on file   Intimate Partner Violence: Not on file   Housing Stability: Not on file         Review of Systems  As documented in the HPI. Currently no complaints of CP or DIB.     Physical Exam  Constitutional:       General: She is not in acute distress.     Appearance: She is well-developed.   HENT:      Head: Normocephalic and atraumatic.   Cardiovascular:      Rate and Rhythm: Normal rate and regular rhythm.      Heart sounds: Normal heart sounds. No murmur heard.  Pulmonary:      Effort: Pulmonary effort is normal. No respiratory distress.      Breath sounds: Normal breath sounds.   Skin:     General: Skin is warm and dry.   Neurological:      Mental Status: She is alert.   Psychiatric:         Behavior: Behavior normal.             Documentation was done using voice recognition dragon software. Efforts were made to ensure accuracy; however, inadvertent, unintentional computerized transcription errors may be present.     --Mitchel Honour, MD on 06/20/2021    An electronic signature was used to authenticate this note.

## 2021-06-21 LAB — TSH: TSH: 1.45 u[IU]/mL (ref 0.27–4.20)

## 2021-06-21 LAB — HCG, QUANTITATIVE, PREGNANCY: hCG Quant: <5 m[IU]/mL (ref <5.0)

## 2021-07-07 ENCOUNTER — Ambulatory Visit
Admit: 2021-07-07 | Discharge: 2021-07-07 | Payer: BLUE CROSS/BLUE SHIELD | Attending: Internal Medicine | Primary: Family Medicine

## 2021-07-07 DIAGNOSIS — D485 Neoplasm of uncertain behavior of skin: Secondary | ICD-10-CM

## 2021-07-07 NOTE — Patient Instructions (Addendum)
Thank you for visiting [facility] today! Please follow the instructions below as we discussed in clinic:      Biopsy Wound Care Instructions  Cleanse the wound with mild soapy water daily.   Gently dry the area.  Apply Vaseline or petroleum jelly to the wound using a cotton tipped applicator.  Cover with a clean bandage.  Repeat this process until the biopsy site is healed.  You may shower and bathe as usual.   ** Biopsy results generally take around 7 business days to come back.  If you have not heard from [place] by then, please call the office at [phone] between 8AM and 4PM Monday through Friday.     SUNSCREENS: UVA and UVB PROTECTION    Sunlight consists of two types of light that can cause or worsen most skin problems:    UVA (ultraviolet A)  UVB (ultraviolet B)   Causes brown spots/sun spots Causes skin cancer   Causes wrinkles Causes sunburn   Causes aging Responsible for tanning    Worsens rosacea Strongest in summer    Less variation with seasons Peak hours 10am-2pm   All year round  SPF rates UVB protection    All day strong    No rating system available     Passes through glass and clouds      *Sunscreens that block both UVA and UVB light contain:  Zinc Oxide (should contain at least 5% zinc oxide)  Titanium Dioxide  Parsol or Avobenzone (additives improve stability of Avobenzone)  There are other UVA blocking ingredients but they are not as complete as these three.    Tips/Suggestions  1) Always use sunscreens with SPF of 30 or higher  2) Make sure the sunscreen has zinc oxide or other UVA block such as Helioplex or Anthelios in product  3) Remember there is no "safe UV light:...so there is no such thing as a safe suntan.  4) Apply sunscreen daily (even in winter and on cloudy days) and reapply every 2 to 4 hours depending on activity.  5) Approximately one ounce (a shot glass) is necessary to adequately cover the entire body.  6) Approximately one teaspoon is necessary to adequately cover  the face    TINTED SUNSCREENS  - La Roche Posay. Anthelios mineral tinted sunscreen. SPF. 50  Avene. Solaire UV Mineral Multi-Defense Tinted Sunscreen SPF 50+   Avene. Mineral Tinted Compact SPF 5.   Tizo 3 facial primer tinted SPF 40  Eltamd Uv Glow Tinted Broad-Spectrum Spf 36  Eltamd Uv Restore Tinted Broad-Spectrum Spf 40   Eltamd Uv Physical Broad-Spectrum Spf 41   Eltamd Uv Elements Broad-Spectrum Spf 44   Dermablend. Cover Creme. High-performance cream foundation for maximum coverage SPF 30   Vichy. Capital Soleil Tinted 100% Mineral Sunscreen SPF 60%.   Vichy. [place] [employer]. [place].   Isdin. Eryfotona Ageless. Ultralight tinted mineral sunscreen.   Isdin. Isdinceutics Mineral Brush.   Supergoop Mineral CC cream (Comes in several shades, friendly for pigmented skin)  CoTz Flawless Complexion SPF 50 tinted  CoTz Face Prime and Protect SPF 40 tinted    CHEMICAL SUNSCREEN  - La Roche-Posay Anthelios Melt-in Milk Body Face Sunscreen Lotion Broad Spectrum SPF 81 or 100   - La Roche-Posay ANTHELIOS COOLING WATER SUNSCREEN LOTION SPF 60  - La Roche-Posay ANTHELIOS LOTION SPRAY SUNSCREEN SPF 60  - La Roche-Posay ANTHELIOS ACTIVEWEAR SPORT SUNSCREEN LOTION SPF 60    MOISTURIZER WITH CHEMICAL SUNSCREEN  NON-COMEDOGENIC  - CeraVe Facial Moisturizing Lotion AM with Sunscreen, Broad Spectrum SPF 30: Ceramides/niacinamide/HA.   - CeraVe Ultra-Light Moisturizing Lotion with Sunscreen, Broad Spectrum SPF 30. Ceramides/HA   - La Roche-Posay Toleriane Double Repair Moisturizer SPF 57. Ceramine/Niacinamide. Very dry skin  - Eucerin Daily Protection. Moisturizing Face Lotion Sunscreen SPF 30  - Cetaphil?? PRO Dermacontrol. Oil Absorbing Moisturizer SPF 43: Very oily skin  - Cetaphil?? Daily Facial Moisturizer SPF 20: Dry skin  - Neutrogena Visibly Even Daily Moisturizer with Sunscreen, Broad Spectrum SPF [place] Water Gel Sunscreen, Broad  Spectrum SPF 25  - Aveeno Positively Radiant Daily Moisturizer, Broad Spectrum SPF 30    MOISTURIZER WITH PHYSICAL SUNSCREEN. NON-COMEDOGENIC  - Neutrogena. Ultra-Calming Daily Moisturizer Broad Spectrum SPF30  - Elta MD UV Physical Broad Spectrum SPF 37. Water resistant.  - La Roche-Posay Anthelios SPF 50 Ultra Light Mineral Sunscreen Fluid. Water resistant  - La Roche-Posay Anthelios 100% Mineral Sunscreen Moisturizer with Hyaluronic Acid  - COOLA Mineral Face SPF 30 Chartered loss adjuster. Water resistant  - Avene SPF 50+ Mineral Light Hydrating Sunscreen Lotion. Water resistant.  - CeraVe Skin Renewing Day Cream Broad Spectrum SPF 57  - Aveeno Ultra-Calming Daily Moisturizer Broad Spectrum SPF 30    The following are some examples of vendors of sun protective clothing:  - Coolibar (www.coolibar.com)  - Sun Precautions (www.sunprecautions.com)  - Sunday Afternoons (www.sundayafternoons.com)  Renelda Mom (www.wallaroohats.com)  - Cabana life (www.cabanalife.com)    Remember the best sunscreen is whichever one you will wear every day!

## 2021-07-07 NOTE — Progress Notes (Signed)
[facility]  Lannie Fields, MD  [phone]    Date of Visit: 07/07/2021    Rhonda Kline is a 41 y.o. female who presents for skin lesions. New pt    Chief Complaint:   Chief Complaint   Patient presents with    Skin Exam     HX of skin cancer spot on back of neck        History of Present Illness:    Patient denies any other new or changing skin lesions. They would like all of their skin lesions to be evaluated for skin cancer.    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  Skin: No new or changing moles, no history of keloids or hypertrophic scars.    Past Medical History, Family History, Surgical History, Medications and Allergies reviewed.    Past Medical History:   Diagnosis Date    B12 deficiency     Cold sore     Primary hypertension     Vitamin D deficiency      No past surgical history on file.    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 Moved here from [place]  -Hx sunburns/sun exposure when young    Physical Examination   No acute distress.    Mood clear/affect appropriate.  Alert and oriented.  Mucous membranes moist.  Sclera anicteric.    Full body skin exam was conducted to include the scalp, face, lips, lids/conjunctiva, ears, neck, chest, abdomen, back, buttock, right and left hands and forearms, right and left leg and feet  and was normal with the following exceptions:   -Atrophic white scar on L upper back with pink scaly papule at proximal edge   - On trunk and bilateral upper and lower extremities, there are multiple well-circumscribed, homogenous tan to brown macules and papules   - Multiple tan to light brown macules on face, shoulders and arms in a photo-distributed pattern  -Stuck on brown papules x2 on L upper eyelid   -Well healed scar(s) at site of prior skin cancer procedures      Assessment and Plan     1. Neoplasm of uncertain behavior of skin  -L upper back  -Favor SCC vs. BCC. Located within/near scar from prior unknown NMSC  -Will request records from prior derm for pathology and treatment that was done. Pending results from today's shave bx and prior notes consider excision vs. Mohs if concern for recurrence  *Shave biopsy procedure note:  -The procedure was discussed in detail. We also reviewed the risks of bleeding, scar, and infection. A consent form was signed by the patient.   -The lesion (s) to be removed on L uppre back was marked with a surgical pen.  Alcohol was used  to cleanse the site. Local anesthesia was acheived with 1% lidocaine with epinephrine. Shave removal of the lesion was performed down to mid dermis using a razor blade.  Hemostasis was achieved with aluminum chloride. The wound was dressed with petrolatum and covered with a bandage.  Wound care instructions were reviewed. Specimen (s) sent to pathology.  The specimen bottle(s) were appropriately labeled.    -The patient tolerated the procedure well and there were no immediate complications.    -ROI signed to request records from Dr. Jarvis Newcomer office       2. Multiple benign melanocytic nevi of upper extremity, lower extremity, and trunk  -Benign, reassurance   - Reviewed relationship with sun exposure  - Rec daily sunscreen with SPF 30, broad spectrum      3. Lentigines  -Benign, reassurance   - Reviewed relationship with sun exposure  - Rec  daily sunscreen with SPF 30, broad spectrum      4. History of nonmelanoma skin cancer  -Rec FBSE in 6 mo vs. 1 year pending results above      5. Other seborrheic keratosis  -Benign, reassurance      RTC pending bx results     Note is transcribed using voice recognition software. Inadvertent computerized transcription errors may be present.    Vallarie Mare, MD

## 2021-07-09 LAB — DERMATOLOGY PATHOLOGY

## 2021-07-10 NOTE — Telephone Encounter (Signed)
Called and left message for patient to call back office.

## 2021-07-10 NOTE — Telephone Encounter (Signed)
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 Prior records show similar skin cancer in that area so consider this to be recurrent. Recommend excision at this time. Pt in agreement and will schedule on a surgery day with me after she finishes renovating her bathroom (a lot of arm movements/activity). Jenny Reichmann can you schedule her for this?    Vallarie Mare, MD

## 2021-07-10 NOTE — Telephone Encounter (Signed)
Patient scheduled for 08/07/21 at 1:30 pm.

## 2021-07-20 ENCOUNTER — Other Ambulatory Visit: Payer: Self-pay | Admitting: Family Medicine

## 2021-07-20 DIAGNOSIS — I10 Essential (primary) hypertension: Secondary | ICD-10-CM

## 2021-07-21 NOTE — Telephone Encounter (Signed)
Refill until next visit due 10/03/21 per last OV notes.  Requested Prescriptions  Pending Prescriptions Disp Refills   labetalol (NORMODYNE) 100 MG tablet [Pharmacy Med Name: LABETALOL HCL 100 MG TABLET] 180 tablet 0    Sig: Take 1 tablet (100 mg total) by mouth 2 (two) times daily. OFFICE VISIT NEEDED FOR ADDITIONAL REFILLS     Cardiovascular:  Beta Blockers Failed - 07/20/2021  7:42 AM      Failed - Valid encounter within last 6 months    Recent Outpatient Visits          9 months ago Annual physical exam   [facility], Dionne Bucy, MD   1 year ago Arm numbness left   [facility] [place], Dionne Bucy, MD   1 year ago Hypertension, unspecified type   [facility] Trinna Post, [place]   1 year ago Annual physical exam   [facility] Carles Collet M, [place]   1 year ago Injury of right index finger, initial encounter   [facility] Carles Collet M, [place]             Passed - Last BP in normal range    BP Readings from Last 1 Encounters:  10/03/20 128/89         Passed - Last Heart Rate in normal range    Pulse Readings from Last 1 Encounters:  10/03/20 89

## 2021-08-07 ENCOUNTER — Encounter: Payer: BLUE CROSS/BLUE SHIELD | Attending: Internal Medicine | Primary: Family Medicine

## 2021-08-18 NOTE — Telephone Encounter (Signed)
Pt is calling. She wants to know if it will be ok to exercise after she has the excision on her back? She is also traveling that weekend. She would be driving is that ok?    Ph. [phone].

## 2021-08-19 NOTE — Telephone Encounter (Signed)
See phone note

## 2021-08-19 NOTE — Telephone Encounter (Signed)
Informed patient of Dr Morris's response.   Patient verbalized understanding.

## 2021-08-19 NOTE — Telephone Encounter (Signed)
Pt returning missed phone call stated when you call back she might be able to answer phone she is about to go on a field trip but can you leave a message please   Please advise  C/b [phone]

## 2021-08-19 NOTE — Telephone Encounter (Signed)
Called and left message for patient to call back office.

## 2021-08-21 ENCOUNTER — Ambulatory Visit
Admit: 2021-08-21 | Discharge: 2021-08-21 | Payer: BLUE CROSS/BLUE SHIELD | Attending: Internal Medicine | Primary: Family Medicine

## 2021-08-21 DIAGNOSIS — C44519 Basal cell carcinoma of skin of other part of trunk: Secondary | ICD-10-CM

## 2021-08-21 NOTE — Patient Instructions (Addendum)
You had a procedure performed today    Make a nursing visit to have your stitches removed in 10-14 days.    Please follow the wound care instructions below for 1-2 weeks. If you have not received your final excision results within 2 weeks, please contact your physician.    WOUND CARE INSTRUCTIONS  Leave your pressure bandage on for 48 hours and do not get the bandage wet.   You will not need to perform any wound care until this bandage is removed.   When you are ready to start wound care, wash your hands thoroughly before starting  Begin cleaning surgery site 1-2 times a day with a mild soap (i.e. Dove, Cetaphil, Cerave) and water. Do not use anything antibacterial, as this will dry out your site.  Dry the area with a clean gauze, Q tip, or wash cloth  Apply a generous amount of plain vaseline with a clean Q tip where you see the sutures.  Avoid using Neosporin, or any bacterial ointment as this is likely to cause an allergic reaction to the site.  Cut a non-stick bandage to fit the surgery site then use paper tape to hold in place.  You will continue to clean the area with mild soap and water, vaseline and a bandage twice daily for 1-2 weeks    If the surgery site is located on your arm/hand/shoulder/back, we ask that you DO NOT LIFT ANYTHING HEAVIER THAN A GALLON OF MILK FOR TWO WEEKS    If your site is on your forehead, or near your eye, you will want to use ice packs to decrease swelling of the area. Please apply ice packs every hour for 20 minutes while awake and try to sleep elevated for the next two nights.  For surgical areas on your arms/legs, try to keep the area elevated above the level of your heart as much as possible. Frequent gentle rubbing of your fingers or toes in that area will prevent numbness and stiffness.  For surgical areas on your head/neck, do not bend over or stoop. Do not drop your head, as this increases blood to the surgical area and can induce bleeding.    BATHING: It is ok to begin  bathing or showering once the pressure bandage is removed Do not let direct water pressure hit the surgery site. It is okay if it gets wet, just let the water roll over gently.    PAIN: Tylenol only for the first 24 hours. After 24 hours, it is OK to start aspirin, Ibuprofen, Motrin or Aleve (if you can take these medications). Please follow the dosing on the bottle and do not go over the maximum recommended doses. Significant pain/discomfort is unusual and should be reported to our office.    BLEEDING: A mild amount of blood on the bandage is expected. Soaking through the bandage is NOT normal. If this occurs, take off the bandage and apply uninterrupted pressure to the bleeding site for 20 minutes by the clock. If this does not stop the bleeding, hold pressure for another 20 minutes (and add an ice pack on top) without peaking. If you check again and the site is still bleeding, then call our office.    Notify your physician if you have:  Bleeding that does not stop after 20 minutes of continuous pressure.  Yellowish/greenish discharge from the treated area  Increasing tenderness or pain  Warmth of the area and/or fever over 101 F  Red streaks up the arm or  leg close to the treated area    ** Surgery excision results generally take around 7 business days to come back.  If you have not heard from [place] by then or have any other concerns, please call the office at [phone].    *Please note that biopsy results are released to both the patient and physician at the same time in [place].  Please allow time for your physician to review the results.  One of our staff members will reach out to you with the results and plan.

## 2021-08-21 NOTE — Progress Notes (Signed)
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 of prior [facility])  Location: Left upper back   Dermatopathology Accession #: [id number]   Pre-Operative Lesion Size: BCC + prior scar excised today measuring 1cm x 1.2cm   Excised Diameter Size: 1.8 cm x 2cm    -Pacemaker/ICD: No  -Joint prosthesis: No  -Difficulty with numbing in the past: No  -Local Anesthesia Reaction: No  -Artificial Heart Valve: No  -Organ Transplant: No  -Immunosuppression: No  -Bleeding/Clotting Disorders: No  -Anticoagulant Therapy: No  -Prior problems with wound healing: No     CONSENT:  Risks, benefits and alternatives to excision were discussed with the patient. Patient understands the possibility for the following:  Bleeding, discomfort, infection, scar (100% chance), and the potential need for further testing or treatment, including surgical. Patient expressed understanding and written consent was obtained. After informed consent, confirmation of site and patient identity was performed and the patient underwent the procedureas follows:    PROCEDURE:  The patient was placed in a right lateral decubitus position. The lesion was outlined with 51m margins. An ellipse was designed around the lesion and necessary margins to conform to relaxed skin tension lines in an effort to minimize scarring and deformity. Anesthesia was obtained using 5cc of 1% lidocaine with epinephrine. The area was prepped with Povidine iodine and draped in a sterile fashion. Using a #15 surgical blade, the excision was carried out along pre-marked lines to down to the level of fat. The lesional tissue was removed, notched at 12 o clock toward scalp for orientation, and placed in a labeled container to be sent to dermatopathology for final diagnosis. In order to allow for tissue movement and optimal wound closure, the surrounding skin was undermined. Hemostasis was  achieved using electrodessication. The deep layers composed of the subcutaneous fat, superficial fascia, and dermis were closed with buried vertical mattress sutures using PDS 3-0. The epidermis was meticulously approximated with running subcuticular sutures resulting in a linear closure with little to no wound tension using prolene 3-0.    A non-adherent pressure dressing was applied and wound care instructions were provided verbally and in writing. The patient left alert the operative suite in stable condition.  -Pt traveling this weekend and again in 2 weeks. Rec taking it easy, no heavy lifting etc    Final Wound Length 4cm  EBL: < 5 ml  Complications: none      GVallarie Mare MD

## 2021-08-26 LAB — DERMATOLOGY PATHOLOGY

## 2021-09-08 ENCOUNTER — Encounter: Admit: 2021-09-08 | Discharge: 2021-09-08 | Payer: BLUE CROSS/BLUE SHIELD | Primary: Family Medicine

## 2021-09-08 DIAGNOSIS — Z4802 Encounter for removal of sutures: Secondary | ICD-10-CM

## 2021-09-08 NOTE — Progress Notes (Signed)
Patient presents for suture removal. The wound is well healed without signs of infection.  The sutures are removed. Wound care and activity instructions given. Return prn.

## 2021-10-14 NOTE — Telephone Encounter (Signed)
Patient scheduled for  nurse visit 10/15/21 at 1 pm.

## 2021-10-15 ENCOUNTER — Encounter: Admit: 2021-10-15 | Discharge: 2021-10-15 | Payer: BLUE CROSS/BLUE SHIELD | Primary: Family Medicine

## 2021-10-15 DIAGNOSIS — Z5189 Encounter for other specified aftercare: Secondary | ICD-10-CM

## 2021-10-15 NOTE — Patient Instructions (Signed)
Thank you for visiting [facility] today! Please follow the instructions below as we discussed in clinic:      Start silicone scar sheets or gel to scar daily  When in sun, use daily sunscreen with SPF >30 to prevent further pink of area    SUNSCREENS: UVA and UVB PROTECTION    Sunlight consists of two types of light that can cause or worsen most skin problems:    UVA (ultraviolet A)  UVB (ultraviolet B)   Causes brown spots/sun spots Causes skin cancer   Causes wrinkles Causes sunburn   Causes aging Responsible for tanning    Worsens rosacea Strongest in summer    Less variation with seasons Peak hours 10am-2pm   All year round  SPF rates UVB protection    All day strong    No rating system available     Passes through glass and clouds      *Sunscreens that block both UVA and UVB light contain:  Zinc Oxide (should contain at least 5% zinc oxide)  Titanium Dioxide  Parsol or Avobenzone (additives improve stability of Avobenzone)  There are other UVA blocking ingredients but they are not as complete as these three.    Tips/Suggestions  1) Always use sunscreens with SPF of 30 or higher  2) Make sure the sunscreen has zinc oxide or other UVA block such as Helioplex or Anthelios in product  3) Remember there is no "safe UV light:...so there is no such thing as a safe suntan.  4) Apply sunscreen daily (even in winter and on cloudy days) and reapply every 2 to 4 hours depending on activity.  5) Approximately one ounce (a shot glass) is necessary to adequately cover the entire body.  6) Approximately one teaspoon is necessary to adequately cover the face    TINTED SUNSCREENS  - La Roche Posay. Anthelios mineral tinted sunscreen. SPF. 50  Avene. Solaire UV Mineral Multi-Defense Tinted Sunscreen SPF 50+   Avene. Mineral Tinted Compact SPF 66.   Tizo 3 facial primer tinted SPF 40  Eltamd Uv Glow Tinted Broad-Spectrum Spf 36  Eltamd Uv Restore Tinted Broad-Spectrum Spf 40   Eltamd Uv Physical Broad-Spectrum Spf 41    Eltamd Uv Elements Broad-Spectrum Spf 44   Dermablend. Cover Creme. High-performance cream foundation for maximum coverage SPF 30   Vichy. Capital Soleil Tinted 100% Mineral Sunscreen SPF 60%.   Vichy. [place] [employer]. [place].   Isdin. Eryfotona Ageless. Ultralight tinted mineral sunscreen.   Isdin. Isdinceutics Mineral Brush.   Supergoop Mineral CC cream (Comes in several shades, friendly for pigmented skin)  CoTz Flawless Complexion SPF 50 tinted  CoTz Face Prime and Protect SPF 40 tinted    CHEMICAL SUNSCREEN  - La Roche-Posay Anthelios Melt-in Milk Body Face Sunscreen Lotion Broad Spectrum SPF 80 or 100   - La Roche-Posay ANTHELIOS COOLING WATER SUNSCREEN LOTION SPF 60  - La Roche-Posay ANTHELIOS LOTION SPRAY SUNSCREEN SPF 60  - La Roche-Posay ANTHELIOS ACTIVEWEAR SPORT SUNSCREEN LOTION SPF 60    MOISTURIZER WITH CHEMICAL SUNSCREEN NON-COMEDOGENIC  - CeraVe Facial Moisturizing Lotion AM with Sunscreen, Broad Spectrum SPF 30: Ceramides/niacinamide/HA.   - CeraVe Ultra-Light Moisturizing Lotion with Sunscreen, Broad Spectrum SPF 30. Ceramides/HA   - La Roche-Posay Toleriane Double Repair Moisturizer SPF 60. Ceramine/Niacinamide. Very dry skin  - Eucerin Daily Protection. Moisturizing Face Lotion Sunscreen SPF 30  - Cetaphil PRO Dermacontrol. Oil Absorbing Moisturizer SPF 30: Very oily skin  - Cetaphil  Daily Facial Moisturizer SPF 50: Dry skin  - Neutrogena Visibly Even Daily Moisturizer with Sunscreen, Broad Spectrum SPF 30  - Neutrogena [employer] Gel Sunscreen, Broad Spectrum SPF 25  - Aveeno Positively Radiant Daily Moisturizer, Broad Spectrum SPF 30    MOISTURIZER WITH PHYSICAL SUNSCREEN. NON-COMEDOGENIC  - Neutrogena. Ultra-Calming Daily Moisturizer Broad Spectrum SPF30  - Elta MD UV Physical Broad Spectrum SPF 78. Water resistant.  - La Roche-Posay Anthelios SPF 50 Ultra Light Mineral Sunscreen Fluid. Water resistant  - La Roche-Posay  Anthelios 100% Mineral Sunscreen Moisturizer with Hyaluronic Acid  - COOLA Mineral Face SPF 30 Chartered loss adjuster. Water resistant  - Avene SPF 50+ Mineral Light Hydrating Sunscreen Lotion. Water resistant.  - CeraVe Skin Renewing Day Cream Broad Spectrum SPF 37  - Aveeno Ultra-Calming Daily Moisturizer Broad Spectrum SPF 30    The following are some examples of vendors of sun protective clothing:  - Coolibar (www.coolibar.com)  - Sun Precautions (www.sunprecautions.com)  - Sunday Afternoons (www.sundayafternoons.com)  Renelda Mom (www.wallaroohats.com)  - Cabana life (www.cabanalife.com)    Remember the best sunscreen is whichever one you will wear every day!

## 2021-10-15 NOTE — Progress Notes (Signed)
Chief Complaint   Patient presents with    Wound Check       Pt presents for bumps on either side of midline scar from [facility] excision on 08/21/2021 where PDS 3-0 and prolene 3-0 were used. Prolene was removed on 09/08/2021.    PE:  -Well healed linear scar without papules. Surrounding the linear scar on L upper back there are uniform pink macules in track mark arrangement    A/P:  Railroad track marks  -Reviewed causes of this including superficial/non-absorbable suture that was placed during the surgery. Reviewed it will take time for the pink color to fade both with these areas and the scar line.   -Start silicone sheets /gel daily to area  -Strict sun protection with SPF >30  -All questions answered. Can take 6 mo to a year for scar to fully improve in appearance    Discussed with patient that I am departing from [facility] after December 29, 2021 and the patient will need to start following up with another dermatologist in the area who takes their insurance. Recommend looking on their insurance website or calling to see which dermatologists are in network  Pt says she may be moving as well and will start looking for another derm to have FBSE in 6 mo    Vallarie Mare, MD

## 2021-10-20 ENCOUNTER — Encounter

## 2021-10-20 NOTE — Telephone Encounter (Signed)
LMOM for patient to return call- please verify dose, and sig for Labetolol

## 2021-10-20 NOTE — Telephone Encounter (Signed)
Medication:   Requested Prescriptions     Pending Prescriptions Disp Refills    labetalol (NORMODYNE) 100 MG tablet [Pharmacy Med Name: LABETALOL HCL 100 MG TABLET] 180 tablet      Sig: TAKE 1 TAB BY MOUTH 2 (TWO) TIMES DAILY. OFFICE VISIT NEEDED FOR ADDITIONAL REFILLS    valACYclovir (VALTREX) 500 MG tablet [Pharmacy Med Name: VALACYCLOVIR HCL 500 MG TABLET] 180 tablet 1     Sig: TAKE 1 TABLET BY MOUTH TWICE A DAY - PLAN LIMIT EXCEEDED FILL 04/21/2021        Last Filled:      Patient Phone Number: [phone] (home)     Last appt: 06/20/2021   Next appt: Visit date not found    Last OARRS: No flowsheet data found.

## 2021-10-20 NOTE — Telephone Encounter (Signed)
Please confirm labetaolol dose with patient - is she taking '100mg'$  or '200mg'$  and how often.

## 2021-10-23 NOTE — Telephone Encounter (Signed)
Patient takes '200mg'$  two at qhs    Also she needs refills on her       valACYclovir (VALTREX) 500 MG tablet 180 tablet 1 02/10/2021     Sig - Route: Take 1 tablet by mouth 2 times daily - Oral        Please advise

## 2021-10-24 MED ORDER — LABETALOL HCL 100 MG PO TABS
100 MG | ORAL_TABLET | Freq: Every evening | ORAL | 0 refills | Status: DC
Start: 2021-10-24 — End: 2022-01-21

## 2021-10-24 MED ORDER — VALACYCLOVIR HCL 500 MG PO TABS
500 MG | ORAL_TABLET | ORAL | 0 refills | Status: AC
Start: 2021-10-24 — End: 2022-01-21

## 2022-01-21 ENCOUNTER — Encounter

## 2022-01-21 MED ORDER — VALACYCLOVIR HCL 500 MG PO TABS
500 MG | ORAL_TABLET | ORAL | 0 refills | Status: DC
Start: 2022-01-21 — End: 2022-04-20

## 2022-01-21 MED ORDER — LABETALOL HCL 100 MG PO TABS
100 MG | ORAL_TABLET | Freq: Every evening | ORAL | 0 refills | Status: DC
Start: 2022-01-21 — End: 2022-04-20

## 2022-01-21 NOTE — Telephone Encounter (Signed)
Medication:   Requested Prescriptions     Pending Prescriptions Disp Refills    labetalol (NORMODYNE) 100 MG tablet [Pharmacy Med Name: LABETALOL HCL 100 MG TABLET] 180 tablet 0     Sig: TAKE 2 TABLETS BY MOUTH EVERY EVENING    valACYclovir (VALTREX) 500 MG tablet [Pharmacy Med Name: VALACYCLOVIR HCL 500 MG TABLET] 180 tablet 0     Sig: TAKE 1 TABLET BY MOUTH TWICE A DAY        Last Filled:  10/24/2021 180 tabs 0 refills     Patient Phone Number: [phone] (home)     Last appt: 06/20/2021   Next appt: Visit date not found    Last OARRS: No flowsheet data found.

## 2022-02-26 ENCOUNTER — Encounter: Payer: BLUE CROSS/BLUE SHIELD | Attending: Internal Medicine | Primary: Family Medicine

## 2022-04-17 ENCOUNTER — Encounter

## 2022-04-17 MED ORDER — LEVONORGEST-ETH ESTRAD 91-DAY 0.15-0.03 MG PO TABS
0.15-0.03 MG | ORAL_TABLET | Freq: Every day | ORAL | 0 refills | Status: DC
Start: 2022-04-17 — End: 2022-07-07

## 2022-04-17 NOTE — Telephone Encounter (Signed)
Medication:   Requested Prescriptions     Pending Prescriptions Disp Refills    levonorgestrel-ethinyl estradiol (SEASONALE) 0.15-0.03 MG per tablet [Pharmacy Med Name: LEVONOR-ETH ESTRAD 0.15-0.03] 91 tablet 3     Sig: TAKE 1 TABLET BY MOUTH EVERY DAY        Last Filled:  02/10/2021 #1 3rf     Patient Phone Number: [phone] (home)     Last appt: 06/20/2021   Next appt: Visit date not found    Last OARRS:        No data to display

## 2022-04-18 ENCOUNTER — Encounter

## 2022-04-20 MED ORDER — LABETALOL HCL 100 MG PO TABS
100 MG | ORAL_TABLET | Freq: Every evening | ORAL | 0 refills | Status: DC
Start: 2022-04-20 — End: 2022-07-07

## 2022-04-20 MED ORDER — VALACYCLOVIR HCL 500 MG PO TABS
500 MG | ORAL_TABLET | ORAL | 0 refills | Status: DC
Start: 2022-04-20 — End: 2022-07-07

## 2022-04-20 NOTE — Telephone Encounter (Signed)
Medication:   Requested Prescriptions     Pending Prescriptions Disp Refills    valACYclovir (VALTREX) 500 MG tablet [Pharmacy Med Name: VALACYCLOVIR HCL 500 MG TABLET] 180 tablet 0     Sig: TAKE 1 TABLET BY MOUTH TWICE A DAY    labetalol (NORMODYNE) 100 MG tablet [Pharmacy Med Name: LABETALOL HCL 100 MG TABLET] 180 tablet 0     Sig: TAKE 2 TABLETS BY MOUTH EVERY EVENING        Last Filled:  01/21/22    Patient Phone Number: [phone] (home)     Last appt: 06/20/2021   Next appt: Visit date not found    Last OARRS:        No data to display

## 2022-04-20 NOTE — Telephone Encounter (Signed)
Patient is scheduled

## 2022-07-07 ENCOUNTER — Encounter

## 2022-07-07 MED ORDER — LABETALOL HCL 100 MG PO TABS
100 MG | ORAL_TABLET | Freq: Every evening | ORAL | 0 refills | Status: AC
Start: 2022-07-07 — End: 2022-07-23

## 2022-07-07 MED ORDER — LEVONORGEST-ETH ESTRAD 91-DAY 0.15-0.03 MG PO TABS
ORAL_TABLET | Freq: Every day | ORAL | 0 refills | Status: DC
Start: 2022-07-07 — End: 2022-07-23

## 2022-07-07 MED ORDER — VALACYCLOVIR HCL 500 MG PO TABS
500 | ORAL_TABLET | ORAL | 0 refills | Status: DC
Start: 2022-07-07 — End: 2022-07-23

## 2022-07-23 ENCOUNTER — Encounter
Admit: 2022-07-23 | Discharge: 2022-07-23 | Payer: BLUE CROSS/BLUE SHIELD | Attending: Family Medicine | Primary: Family Medicine

## 2022-07-23 DIAGNOSIS — Z0000 Encounter for general adult medical examination without abnormal findings: Secondary | ICD-10-CM

## 2022-07-23 MED ORDER — LABETALOL HCL 100 MG PO TABS
100 | ORAL_TABLET | Freq: Every evening | ORAL | 2 refills | Status: DC
Start: 2022-07-23 — End: 2022-07-23

## 2022-07-23 MED ORDER — LEVONORGEST-ETH ESTRAD 91-DAY 0.15-0.03 MG PO TABS
ORAL_TABLET | Freq: Every day | ORAL | 2 refills | Status: DC
Start: 2022-07-23 — End: 2022-07-23

## 2022-07-23 MED ORDER — LEVONORGEST-ETH ESTRAD 91-DAY 0.15-0.03 MG PO TABS
ORAL_TABLET | Freq: Every day | ORAL | 2 refills | Status: AC
Start: 2022-07-23 — End: ?

## 2022-07-23 MED ORDER — VALACYCLOVIR HCL 500 MG PO TABS
500 | ORAL_TABLET | Freq: Two times a day (BID) | ORAL | 2 refills | Status: AC
Start: 2022-07-23 — End: ?

## 2022-07-23 MED ORDER — LABETALOL HCL 100 MG PO TABS
100 | ORAL_TABLET | Freq: Every evening | ORAL | 2 refills | Status: AC
Start: 2022-07-23 — End: ?

## 2022-07-23 MED ORDER — VALACYCLOVIR HCL 500 MG PO TABS
500 | ORAL_TABLET | Freq: Two times a day (BID) | ORAL | 2 refills | Status: DC
Start: 2022-07-23 — End: 2022-07-23

## 2022-07-23 NOTE — Progress Notes (Signed)
History and Physical      Chief Complaint:   Rhonda Kline is a 42 y.o. female who presents for complete physical examination.    Chief Complaint   Patient presents with    Annual Exam         Assessment/Plan     [name] was seen today for annual exam.    Diagnoses and all orders for this visit:    Healthcare maintenance  -     Discontinue: levonorgestrel-ethinyl estradiol (SEASONALE) 0.15-0.03 MG per tablet; Take 1 tablet by mouth daily  -     levonorgestrel-ethinyl estradiol (SEASONALE) 0.15-0.03 MG per tablet; Take 1 tablet by mouth daily  -     MAM DIGITAL SCREEN W OR WO CAD BILATERAL; Future  -     Hepatitis B Surface Antigen; Future  -     Hepatitis B Surface Antibody; Future  -     Vitamin D 25 Hydroxy; Future  -     Lipid Panel; Future  -     Glucose, Fasting; Future  -     Vitamin B12; Future  -     Methylmalonic Acid, Serum; Future  -     CBC with Auto Differential; Future    Essential (primary) hypertension  -     Discontinue: labetalol (NORMODYNE) 100 MG tablet; Take 2 tablets by mouth every evening  -     labetalol (NORMODYNE) 100 MG tablet; Take 2 tablets by mouth every evening    Cold sore  -     Discontinue: valACYclovir (VALTREX) 500 MG tablet; Take 1 tablet by mouth 2 times daily  -     valACYclovir (VALTREX) 500 MG tablet; Take 1 tablet by mouth 2 times daily    B12 deficiency  -     Vitamin B12; Future  -     Methylmalonic Acid, Serum; Future    Vitamin D deficiency  -     Vitamin D 25 Hydroxy; Future    Primary hypertension    History of skin cancer        Annual physical exam done today. Counseled on preventative care and a healthy lifestlye.      -Hypertension: Stable.  Continue labetalol  - Cold sores: Stable.  Continue Valtrex.  - B12 deficiency: Stable.  Continue oral B12.  Will recheck.  - Vitamin D deficiency: Stable.  Continue current regimen.  - History of skin cancer: Stable.  Continue to follow-up with dermatology.    Discussed medications with patient, who voiced understanding  of their use and indications. All questions answered.    No follow-ups on file.         HPI:  CHCA    HTN:  Taking labetalol 232m qhs which has been working well for her.  No symptoms concerning for end organ damage.      B12 Deficiency:   Taking ? Dose of oral B12 daily. Hx of taking injections through May 2022.      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 If she feels a cold sore coming on takes it BID for a short period of time.      Vit D deficiency:  takes 1000 units of Vit D daily.      History of skin cancer:  BCC or SCC. Seeing dermatology regularly.       HM: s/p normal pap smear with negative HPV 09/27/19. Husband is s/p a vasectomy.  SH:  07/2022: Kids now go to [employer] which is going much better then their previous school. 01/2021:  Extended family is in [place] including her parents.  Works as an Futures trader.  Lives with husband (started new job recently as a Film/video editor for [employer]) and 2 daughters who are patients here and go to [employer].      Vitals:    07/23/22 1045   BP: 114/72   Pulse: 99   SpO2: 100%   Weight: 66.2 kg (146 lb)   Height: 1.651 m (5' 5"$ )       Wt Readings from Last 3 Encounters:   07/23/22 66.2 kg (146 lb)   06/20/21 68 kg (150 lb)   02/10/21 69.1 kg (152 lb 6.4 oz)     BP Readings from Last 3 Encounters:   07/23/22 114/72   06/20/21 112/76   02/10/21 110/80       Patient Active Problem List   Diagnosis    B12 deficiency    Cold sore    Primary hypertension    Vitamin D deficiency    History of skin cancer       Preventive Care:  Health Maintenance   Topic Date Due    Hepatitis B vaccine (1 of 3 - 3-dose series) Never done    Cervical cancer screen  Never done    Flu vaccine (1) 12/30/2021    COVID-19 Vaccine (3 - 2023-24 season) 01/30/2022    Depression Screen  07/24/2023    Lipids  02/12/2026    DTaP/Tdap/Td vaccine (2 - Td or Tdap) 06/21/2031    Hepatitis C screen  Completed    HIV screen  Completed    Hepatitis A vaccine  Aged  Out    Hib vaccine  Aged Out    HPV vaccine  Aged Out    Polio vaccine  Aged Out    Meningococcal (ACWY) vaccine  Aged Out    Pneumococcal 0-64 years Vaccine  Aged Out    Varicella vaccine  Discontinued    Diabetes screen  Discontinued        Immunization History   Administered Date(s) Administered    COVID-19, PFIZER PURPLE top, DILUTE for use, (age 38 y+), 69mg/0.3mL 09/11/2019, 10/02/2019    Influenza, FLUCELVAX, (age 3471mo+), MDCK, PF, 0.569m01/20/2023    TDaP, ADACEL (age 7124y-64y [place] 71y+), IM, 0.56m38m1/20/2023       No Known Allergies  Outpatient Medications Marked as Taking for the 07/23/22 encounter (Office Visit) with HarMitchel HonourD   Medication Sig Dispense Refill    levonorgestrel-ethinyl estradiol (SEASONALE) 0.15-0.03 MG per tablet Take 1 tablet by mouth daily 91 tablet 2    labetalol (NORMODYNE) 100 MG tablet Take 2 tablets by mouth every evening 180 tablet 2    valACYclovir (VALTREX) 500 MG tablet Take 1 tablet by mouth 2 times daily 180 tablet 2       Past Medical History:   Diagnosis Date    B12 deficiency     Cold sore     Primary hypertension     Vitamin D deficiency      No past surgical history on file.  Family History   Problem Relation Age of Onset    No Known Problems Mother     Cancer Father         skin cancer    Parkinson's Disease Father     No Known Problems Sister     No  Known Problems Sister     No Known Problems Brother     Cancer Maternal Grandmother         breast cancer    Heart Attack Maternal Grandmother     Alzheimer's Disease Maternal Grandfather     Cancer Paternal Grandmother      Social History     Socioeconomic History    Marital status: Married     Spouse name: Not on file    Number of children: Not on file    Years of education: Not on file    Highest education level: Not on file   Occupational History    Not on file   Tobacco Use    Smoking status: Never    Smokeless tobacco: Never   Substance and Sexual Activity    Alcohol use: Not Currently    Drug  use: Never    Sexual activity: Not on file   Other Topics Concern    Not on file   Social History Narrative    Not on file     Social Determinants of Health     Financial Resource Strain: Low Risk  (07/23/2022)    Overall Financial Resource Strain (CARDIA)     Difficulty of Paying Living Expenses: Not hard at all   Food Insecurity: No Food Insecurity (07/23/2022)    Hunger Vital Sign     Worried About Running Out of Food in the Last Year: Never true     [place] in the Last Year: Never true   Transportation Needs: Unknown (07/23/2022)    PRAPARE - Armed forces logistics/support/administrative officer (Medical): Not on file     Lack of Transportation (Non-Medical): No   Physical Activity: Not on file   Stress: Not on file   Social Connections: Not on file   Intimate Partner Violence: Not on file   Housing Stability: Unknown (07/23/2022)    Housing Stability Vital Sign     Unable to Pay for Housing in the Last Year: Not on file     Number of Places Lived in the Last Year: Not on file     Unstable Housing in the Last Year: No       Review of Systems:  A comprehensive review of systems was negative except for what was noted in the HPI.     Physical Exam:   Vitals:    07/23/22 1045   BP: 114/72   Pulse: 99   SpO2: 100%   Weight: 66.2 kg (146 lb)   Height: 1.651 m (5' 5"$ )     Body mass index is 24.3 kg/m.   Constitutional: She is oriented to person, place, and time. She appears well-developed and well-nourished. No distress.   HEENT:   Head: Normocephalic and atraumatic.   Right Ear: Tympanic membrane, external ear and ear canal normal.   Left Ear: Tympanic membrane, external ear and ear canal normal.   Nose: Nose normal.   Mouth/Throat: Oropharynx is clear and moist, and mucous membranes are normal.  There is no cervical adenopathy.  Eyes: Conjunctivae and extraocular motions are normal. Pupils are equal, round, and reactive to light.   Neck: Neck supple. No mass and no thyromegaly present.   Cardiovascular: Normal rate, regular  rhythm, normal heart sounds.  Exam reveals no gallop and no friction rub.  No murmur heard.  Pulmonary/Chest: Effort normal and breath sounds normal. No respiratory distress. She has no wheezes, rhonchi or rales.  Abdominal: Soft, non-tender. Bowel sounds are normal. She exhibits no palpable organomegaly or mass.   Musculoskeletal: Normal range of motion. She exhibits no edema.   Neurological: She is alert and oriented.  No cranial nerve deficit. Coordination normal.   Skin: Skin is warm and dry. There is no rash or erythema.   Psychiatric: She has a normal mood and affect. Her speech is normal and behavior is normal. Judgment, cognition and memory are normal.           Documentation was done using voice recognition dragon software. Efforts were made to ensure accuracy; however, inadvertent, unintentional computerized transcription errors may be present.     --Mitchel Honour, MD on 07/23/2022    An electronic signature was used to authenticate this note.

## 2022-07-28 ENCOUNTER — Encounter

## 2022-07-28 ENCOUNTER — Inpatient Hospital Stay: Admit: 2022-07-28 | Payer: BLUE CROSS/BLUE SHIELD | Primary: Family Medicine

## 2022-07-28 DIAGNOSIS — Z0000 Encounter for general adult medical examination without abnormal findings: Secondary | ICD-10-CM

## 2022-07-28 LAB — GLUCOSE, FASTING: Glucose, Fasting: 104 mg/dL — ABNORMAL HIGH (ref 70–99)

## 2022-07-28 LAB — LIPID PANEL
Cholesterol, Total: 186 mg/dL (ref 0–199)
HDL: 47 mg/dL (ref 40–60)
LDL Calculated: 117 mg/dL — ABNORMAL HIGH (ref <100)
Triglycerides: 108 mg/dL (ref 0–150)
VLDL Cholesterol Calculated: 22 mg/dL

## 2022-07-28 LAB — CBC WITH AUTO DIFFERENTIAL
Basophils %: 0.7 %
Basophils Absolute: 0.1 10*3/uL (ref 0.0–0.2)
Eosinophils %: 1.3 %
Eosinophils Absolute: 0.1 10*3/uL (ref 0.0–0.6)
Hematocrit: 41.5 % (ref 36.0–48.0)
Hemoglobin: 14 g/dL (ref 12.0–16.0)
Lymphocytes %: 22 %
Lymphocytes Absolute: 1.7 10*3/uL (ref 1.0–5.1)
MCH: 31.2 pg (ref 26.0–34.0)
MCHC: 33.7 g/dL (ref 31.0–36.0)
MCV: 92.7 fL (ref 80.0–100.0)
MPV: 8.7 fL (ref 5.0–10.5)
Monocytes %: 4 %
Monocytes Absolute: 0.3 10*3/uL (ref 0.0–1.3)
Neutrophils %: 72 %
Neutrophils Absolute: 5.7 10*3/uL (ref 1.7–7.7)
Platelets: 206 10*3/uL (ref 135–450)
RBC: 4.48 M/uL (ref 4.00–5.20)
RDW: 12.8 % (ref 12.4–15.4)
WBC: 7.9 10*3/uL (ref 4.0–11.0)

## 2022-07-29 LAB — VITAMIN D 25 HYDROXY: Vit D, 25-Hydroxy: 31.9 ng/mL (ref >=30)

## 2022-07-29 LAB — VITAMIN B12: Vitamin B-12: 1452 pg/mL — ABNORMAL HIGH (ref 211–911)

## 2022-07-29 LAB — HEPATITIS B SURFACE ANTIBODY: Hep B S Ab: >1000 m[IU]/mL

## 2022-07-29 LAB — HEPATITIS B SURFACE ANTIGEN: Hep B S Ag Interp: NONREACTIVE

## 2022-08-01 LAB — METHYLMALONIC ACID, SERUM: Methylmalonic Acid: <0.1 umol/L (ref 0.00–0.40)

## 2023-05-06 NOTE — Telephone Encounter (Signed)
 Patient called about her BP being high lately 170/116 Yesterday and 150/105 today. Patient is scheduled for a VV on Tuesday.  Please advise what patient can do for now

## 2023-05-06 NOTE — Telephone Encounter (Signed)
 Left voice mail to call back

## 2023-05-06 NOTE — Telephone Encounter (Addendum)
 Has she had any other readings? How was it prior to this?      Any symptoms? Go to the ER if having chest pain or new/worsening headaches.     Has she confirmed that her cuff is accurate?  If not have her come in for a nurse visit to compare to our cuff. O

## 2023-05-07 NOTE — Telephone Encounter (Signed)
 Patient is scheduled for Monday 05/10/2023 at 11: 40 ami n office.  Patient will bring in BP log with her.  Patient also informed to go to ED if she starts having chest pains and bad headaches.

## 2023-05-10 ENCOUNTER — Encounter: Admit: 2023-05-10 | Payer: BLUE CROSS/BLUE SHIELD | Admitting: Family Medicine | Primary: Family Medicine

## 2023-05-10 VITALS — BP 130/80 | HR 100 | Temp 98.60000°F | Ht 65.0 in | Wt 142.8 lb

## 2023-05-10 DIAGNOSIS — I10 Essential (primary) hypertension: Principal | ICD-10-CM

## 2023-05-10 MED ORDER — METOPROLOL SUCCINATE ER 50 MG PO TB24
50 | ORAL_TABLET | Freq: Every day | ORAL | 1 refills | Status: DC
Start: 2023-05-10 — End: 2023-08-31

## 2023-05-10 NOTE — Progress Notes (Signed)
 Rhonda Kline   Date of Birth:  04/21/1981    Date of Visit:  05/10/2023    No Known Allergies  Outpatient Medications Marked as Taking for the 05/10/23 encounter (Office Visit) with Darlina Rumpf, MD   Medication Sig Dispense Refill    metoprolol s

## 2023-05-11 ENCOUNTER — Encounter: Payer: BLUE CROSS/BLUE SHIELD | Attending: Family Medicine | Primary: Family Medicine

## 2023-07-27 ENCOUNTER — Telehealth
Admit: 2023-07-27 | Discharge: 2023-07-27 | Payer: BLUE CROSS/BLUE SHIELD | Attending: Family Medicine | Primary: Family Medicine

## 2023-07-27 DIAGNOSIS — E538 Deficiency of other specified B group vitamins: Secondary | ICD-10-CM

## 2023-07-27 MED ORDER — HYDROCHLOROTHIAZIDE 12.5 MG PO CAPS
12.5 | ORAL_CAPSULE | Freq: Every morning | ORAL | 0 refills | Status: AC
Start: 2023-07-27 — End: ?

## 2023-07-27 NOTE — Progress Notes (Signed)
 TELEHEALTH EVALUATION -- Audio/Visual   Rhonda Kline (DOB:  [birth year]) has requested to and consented to an audio/video evaluation for the concerns or medical issues discussed below.    [name], was evaluated through a synchronous (real-time) audio-video encounter. The patient (or guardian if applicable) is aware that this is a billable service, which includes applicable co-pays. This Virtual Visit was conducted with patient's (and/or legal guardian's) consent. The visit was conducted pursuant to the emergency declaration under the [employer] and the [employer] waiver authority and the [employer] Act.  Patient identification was verified, and a caregiver was present when appropriate.   The patient was located at Home: [address]  [place] [facility] [id number].   Provider was located at Home (Appt Dept State): [place].        Patient identification was verified at the start of the visit and patient has verbally consented to a telehealth visit: Yes    Total time spent on this encounter: Not billed by time.      Services were provided through a video synchronous discussion virtually to substitute for in-person clinic visit.       Rhonda Kline   Date of Birth:  1980/09/07    Date of Visit:  07/27/2023    No Known Allergies  Outpatient Medications Marked as Taking for the 07/27/23 encounter (Telemedicine) with Darlina Rumpf, MD   Medication Sig Dispense Refill    hydroCHLOROthiazide 12.5 MG capsule Take 1 capsule by mouth every morning 90 capsule 0    metoprolol succinate (TOPROL XL) 50 MG extended release tablet Take 1 tablet by mouth daily 90 tablet 1    levonorgestrel-ethinyl estradiol (SEASONALE) 0.15-0.03 MG per tablet Take 1 tablet by mouth daily 91 tablet 2    valACYclovir (VALTREX) 500 MG tablet Take 1 tablet by mouth 2 times daily 180 tablet 2         There were no vitals filed for this visit.  There is no height or weight on  file to calculate BMI.   There were no vitals taken for this visit.       No data to display                 Wt Readings from Last 3 Encounters:   05/10/23 64.8 kg (142 lb 12.8 oz)   07/28/22 66.2 kg (146 lb)   07/23/22 66.2 kg (146 lb)     BP Readings from Last 3 Encounters:   05/10/23 130/80   07/23/22 114/72   06/20/21 112/76        Chief Complaint   Patient presents with    Hypertension     BP medication        Assessment/Plan:    Assessment & Plan  Hypertension  - Discontinue metoprolol due to potential contribution to weight gain  - Initiate hydrochlorothiazide 12.5 mg daily  - Headaches resolved with better blood pressure control  - Monitor blood pressure and communicate any issues  - Blood work after 1 to 2 weeks on new medication to ensure kidney function and electrolyte balance    Weight gain  - Hypothesized that weight gain may be due to metoprolol  - Switch blood pressure medication to hydrochlorothiazide 12.5 mg daily  - Monitor for any changes in weight  - Conduct TSH test to rule out thyroid issues    B12 deficiency  - Condition stable with oral B12 supplementation  - Recheck B12  level to ensure continued stability    Vitamin D deficiency  - Condition stable  - Recheck vitamin D level to ensure continued stability    Elevated LDL cholesterol  - LDL cholesterol slightly elevated at 117 a year ago  - Recheck LDL cholesterol to monitor lipid levels    Elevated blood glucose  - Blood glucose slightly elevated at 104 a year ago  - Recheck blood glucose to monitor levels    Follow-up  - Patient to follow up in March 2025    [name] was seen today for hypertension.    Diagnoses and all orders for this visit:    B12 deficiency  -     Vitamin B12; Future  -     Methylmalonic Acid, Serum; Future  -     CBC with Auto Differential; Future    Vitamin D deficiency  -     Vitamin D 25 Hydroxy; Future    Essential (primary) hypertension  -     Basic Metabolic Panel; Future    Cold sore    Elevated blood sugar  -      Hemoglobin A1C; Future    Elevated LDL cholesterol level  -     Lipid Panel; Future    Weight gain  -     TSH; Future    Other orders  -     hydroCHLOROthiazide 12.5 MG capsule; Take 1 capsule by mouth every morning          Discussed medications with patient, who voiced understanding of their use and indications. All questions answered.    Return for as scheduled next month for CPE and HTN.        HPI    History of Present Illness  The patient is a 43 year old female who presents today with concerns about weight gain. She has hypertension and was initially taking labetalol, which was switched to metoprolol in December. She wonders if this change has contributed to her weight gain.    She reports satisfactory blood pressure control since the switch to metoprolol on 05/10/2023. Although she has not been monitoring her blood pressure recently, she did check it a few times after the medication change and found it to be within the normal range. She is currently on a daily regimen of metoprolol 50 mg extended release. She also reports that her headaches have resolved since achieving better blood pressure control.    She has observed a pattern of weight gain from fall to Christmas over the past 5 years, followed by a period of weight loss from January to April or May. This year, despite maintaining her usual routine of running 3 miles and dietary modifications since January, she has experienced an unexpected weight gain of approximately 5 pounds instead of the anticipated weight loss. Her current weight is 146.7 pounds. She reports no changes in her exercise tolerance.    MEDICATIONS  Current: Metoprolol, B12.  Discontinued: Labetalol.        Results  Laboratory Studies  LDL cholesterol was 117 a year ago. Blood sugar was 104 a year ago.      Social History     Socioeconomic History    Marital status: Married     Spouse name: Not on file    Number of children: Not on file    Years of education: Not on file    Highest  education level: Not on file   Occupational History    Not on file   Tobacco  Use    Smoking status: Never    Smokeless tobacco: Never   Substance and Sexual Activity    Alcohol use: Never    Drug use: Never    Sexual activity: Yes     Partners: Male   Other Topics Concern    Not on file   Social History Narrative    Not on file     Social Determinants of Health     Financial Resource Strain: Low Risk  (07/23/2022)    Overall Financial Resource Strain (CARDIA)     Difficulty of Paying Living Expenses: Not hard at all   Food Insecurity: No Food Insecurity (07/27/2023)    Hunger Vital Sign     Worried About Running Out of Food in the Last Year: Never true     Ran Out of Food in the Last Year: Never true   Transportation Needs: No Transportation Needs (07/27/2023)    PRAPARE - Therapist, art (Medical): No     Lack of Transportation (Non-Medical): No   Physical Activity: Not on file   Stress: Not on file   Social Connections: Not on file   Intimate Partner Violence: Not on file   Housing Stability: Low Risk  (07/27/2023)    Housing Stability Vital Sign     Unable to Pay for Housing in the Last Year: No     Number of Times Moved in the Last Year: 0     Homeless in the Last Year: No       Review of Systems  As documented in the HPI. Currently no complaints of CP or DIB.     Physical Exam  Vital Signs  Weight is 146.7 pounds.    Physical Exam  Constitutional:       General: She is not in acute distress.     Appearance: Normal appearance. She is not ill-appearing.   HENT:      Head: Normocephalic and atraumatic.      Nose: Nose normal.   Pulmonary:      Effort: Pulmonary effort is normal. No respiratory distress.   Neurological:      General: No focal deficit present.      Mental Status: She is alert.   Psychiatric:         Mood and Affect: Mood normal.         Behavior: Behavior normal.                   The patient (or guardian, if applicable) and other individuals in attendance with the patient were  advised that Artificial Intelligence will be utilized during this visit to record, process the conversation to generate a clinical note, and support improvement of the AI technology. The patient (or guardian, if applicable) and other individuals in attendance at the appointment consented to the use of AI, including the recording.        -Darlina Rumpf, MD on 07/27/2023    An electronic signature was used to authenticate this note.

## 2023-07-27 NOTE — Telephone Encounter (Signed)
 Please advise, thank you.

## 2023-07-27 NOTE — Telephone Encounter (Signed)
 Please call the patient to help them schedule an appointment if they would like regarding the issue they mentioned in their my chart message. Thank you.   Can do VV now or next week or come in sometime.

## 2023-07-27 NOTE — Telephone Encounter (Signed)
 Patient is scheduled

## 2023-08-12 ENCOUNTER — Encounter

## 2023-08-12 LAB — BASIC METABOLIC PANEL
Anion Gap: 10 (ref 3–16)
BUN: 11 mg/dL (ref 7–20)
CO2: 26 mmol/L (ref 21–32)
Calcium: 9.1 mg/dL (ref 8.3–10.6)
Chloride: 104 mmol/L (ref 99–110)
Creatinine: 0.8 mg/dL (ref 0.6–1.1)
Est, Glom Filt Rate: >90 (ref >60)
Glucose: 83 mg/dL (ref 70–99)
Potassium: 3.9 mmol/L (ref 3.5–5.1)
Sodium: 140 mmol/L (ref 136–145)

## 2023-08-12 LAB — VITAMIN B12: Vitamin B-12: 907 pg/mL (ref 211–911)

## 2023-08-12 LAB — CBC WITH AUTO DIFFERENTIAL
Basophils %: 0.5 %
Basophils Absolute: 0 10*3/uL (ref 0.0–0.2)
Eosinophils %: 2.2 %
Eosinophils Absolute: 0.1 10*3/uL (ref 0.0–0.6)
Hematocrit: 41.9 % (ref 36.0–48.0)
Hemoglobin: 14.3 g/dL (ref 12.0–16.0)
Lymphocytes %: 37.6 %
Lymphocytes Absolute: 2.1 10*3/uL (ref 1.0–5.1)
MCH: 31.3 pg (ref 26.0–34.0)
MCHC: 34.2 g/dL (ref 31.0–36.0)
MCV: 91.6 fL (ref 80.0–100.0)
MPV: 8.9 fL (ref 5.0–10.5)
Monocytes %: 6.2 %
Monocytes Absolute: 0.3 10*3/uL (ref 0.0–1.3)
Neutrophils %: 53.5 %
Neutrophils Absolute: 3 10*3/uL (ref 1.7–7.7)
Platelets: 196 10*3/uL (ref 135–450)
RBC: 4.57 M/uL (ref 4.00–5.20)
RDW: 12.4 % (ref 12.4–15.4)
WBC: 5.6 10*3/uL (ref 4.0–11.0)

## 2023-08-12 LAB — LIPID PANEL
Cholesterol, Total: 172 mg/dL (ref 0–199)
HDL: 53 mg/dL (ref 40–60)
LDL Cholesterol: 107 mg/dL — ABNORMAL HIGH (ref <100)
Triglycerides: 60 mg/dL (ref 0–150)
VLDL Cholesterol Calculated: 12 mg/dL

## 2023-08-12 LAB — TSH: TSH: 3.54 u[IU]/mL (ref 0.27–4.20)

## 2023-08-12 LAB — VITAMIN D 25 HYDROXY: Vit D, 25-Hydroxy: 25.1 ng/mL — ABNORMAL LOW (ref >=30)

## 2023-08-13 LAB — HEMOGLOBIN A1C
Estimated Avg Glucose: 99.7 mg/dL
Hemoglobin A1C: 5.1 %

## 2023-08-15 LAB — METHYLMALONIC ACID, SERUM: Methylmalonic Acid: <0.1 umol/L (ref 0.00–0.40)

## 2023-08-16 ENCOUNTER — Ambulatory Visit
Admit: 2023-08-16 | Discharge: 2023-08-16 | Payer: BLUE CROSS/BLUE SHIELD | Attending: Family Medicine | Primary: Family Medicine

## 2023-08-16 VITALS — BP 138/98 | HR 106 | Ht 65.0 in | Wt 147.0 lb

## 2023-08-16 DIAGNOSIS — Z0000 Encounter for general adult medical examination without abnormal findings: Secondary | ICD-10-CM

## 2023-08-16 MED ORDER — HYDROCHLOROTHIAZIDE 25 MG PO TABS
25 | ORAL_TABLET | Freq: Every morning | ORAL | 1 refills | Status: DC
Start: 2023-08-16 — End: 2023-08-31

## 2023-08-16 NOTE — Progress Notes (Signed)
 History and Physical      Chief Complaint:   Rhonda Kline is a 43 y.o. female who presents for complete physical examination.    Chief Complaint   Patient presents with    Annual Exam       Assessment/Plan:    [name] was seen today for annual exam.    Diagnoses and all orders for this visit:    Healthcare maintenance    Essential (primary) hypertension  -     Basic Metabolic Panel; Future    Palpitations  -     EKG 12 Lead    Other orders  -     hydroCHLOROthiazide (HYDRODIURIL) 25 MG tablet; Take 1 tablet by mouth every morning        Assessment & Plan  Hypertension  - Hypertension worsened since switching from metoprolol to hydrochlorothiazide  - Pleased with weight loss from the change and wishes to continue hydrochlorothiazide  - Discussed options: increase hydrochlorothiazide dosage or reintroduce lower dose of metoprolol  - Opted for increased hydrochlorothiazide dosage  - Will monitor blood pressure and heart rate  - Basic metabolic panel to be checked 1 to 2 weeks after dosage increase    Headaches  - Improved with ibuprofen  - May be related to stress and worsening blood pressure  - Will continue to monitor symptoms  - Brain imaging to be considered if headaches worsen significantly    Palpitations  - Stable and intermittent, possibly more noticeable off metoprolol  - Similar issue years ago, evaluated by cardiology and found non-concerning  - EKG performed today was reassuring  - Reintroduction of metoprolol may be considered if symptoms persist    B12 deficiency  - Stable with oral B12 supplementation    Vitamin D deficiency  - Will start taking 1000 units of vitamin D daily  - Return precautions reviewed    Night sweats  - On OCPs  - Possibly hormonal or related to BP medication change.   - Consider additional eval/GYN referral if persists.     Health maintenance  - Due for a Pap smear next year, can be done here or at a gynecology office  - Has not seen a gynecologist since moving here  - Plans  to get eyes checked due to recent visual changes associated with headaches    Follow-up  - Patient to follow up on 08/31/2023 with blood pressure readings to reevaluate or sooner if needed      Discussed medications with patient, who voiced understanding of their use and indications. All questions answered.    Return in about 22 days (around 09/07/2023).        HPI:   History of Present Illness  The patient is a 43 year old female who presents for a physical and follow-up of her blood pressure.    She reports that her new antihypertensive medication, initiated on 07/27/2023, has been effective in managing her blood pressure. However, she experienced a significant increase in blood pressure yesterday, with a reading of 156/110, which she managed to reduce to 124/84 through rest. She also reported readings of 144/101 and 144/90. She has been experiencing headaches, which she attributes to recent stressors, including her father's fall and the loss of a basketball game. She has been using an Apple watch to monitor her heart rate. She has a history of regular heartbeats, which were evaluated by a cardiologist prior to having kids. She has not experienced these symptoms recently until this week, when she noticed  palpitations while at rest. Her average walking heart rate is 114, and her resting heart rate is 73. She has been experiencing frequent headaches, which started last night and were accompanied by tension. This morning, she reported visual disturbances and took four Advil, which reduced her headache to a manageable level. She does not experience nausea, light sensitivity, sound sensitivity, weakness, or slurred speech with her headaches. She has not had her eyes checked recently. She has been experiencing night sweats, which have been intermittent over the past few years but have become more frequent in the last 3 to 4 weeks. She is currently on Seasonale, which induces menstruation every three months.    She is  currently taking oral B12 supplements and does not take vitamin D.    MEDICATIONS  Current: hydrochlorothiazide, B12, vitamin D    Results  Laboratory Studies  LDL cholesterol was 107. Thyroid function was normal.          Vitals:    08/16/23 1151 08/16/23 1206   BP: (!) 158/84 (!) 138/98   Pulse: (!) 106    SpO2: 100%    Weight: 66.7 kg (147 lb)    Height: 1.651 m (5\' 5" )        Wt Readings from Last 3 Encounters:   08/16/23 66.7 kg (147 lb)   05/10/23 64.8 kg (142 lb 12.8 oz)   07/28/22 66.2 kg (146 lb)     BP Readings from Last 3 Encounters:   08/16/23 (!) 138/98   05/10/23 130/80   07/23/22 114/72       Patient Active Problem List   Diagnosis    B12 deficiency    Cold sore    Essential (primary) hypertension    Vitamin D deficiency    History of skin cancer       Preventive Care:  Health Maintenance   Topic Date Due    Cervical cancer screen  Never done    Flu vaccine (1) 12/31/2022    COVID-19 Vaccine (3 - 2024-25 season) 01/31/2023    Depression Screen  07/26/2024    Breast cancer screen  07/28/2024    Lipids  08/11/2028    DTaP/Tdap/Td vaccine (2 - Td or Tdap) 06/21/2031    Hepatitis C screen  Completed    HIV screen  Completed    Hepatitis A vaccine  Aged Out    Hib vaccine  Aged Out    HPV vaccine  Aged Out    Polio vaccine  Aged Out    Meningococcal (ACWY) vaccine  Aged Out    Meningococcal B vaccine  Aged Out    Pneumococcal 0-49 years Vaccine  Aged Out    Hepatitis B vaccine  Discontinued    Varicella vaccine  Discontinued    Diabetes screen  Discontinued        Immunization History   Administered Date(s) Administered    COVID-19, PFIZER PURPLE top, DILUTE for use, (age 42 y+), 73mcg/0.3mL 09/11/2019, 10/02/2019    Influenza, FLUCELVAX, (age 33 mo+), MDCK, Quadv PF, 0.66mL 06/20/2021    TDaP, ADACEL (age 83y-64y), BOOSTRIX (age 10y+), IM, 0.51mL 06/20/2021       No Known Allergies  Outpatient Medications Marked as Taking for the 08/16/23 encounter (Office Visit) with Darlina Rumpf, MD   Medication Sig  Dispense Refill    hydroCHLOROthiazide (HYDRODIURIL) 25 MG tablet Take 1 tablet by mouth every morning 90 tablet 1    levonorgestrel-ethinyl estradiol (SEASONALE) 0.15-0.03 MG per tablet Take 1 tablet by  mouth daily 91 tablet 2    valACYclovir (VALTREX) 500 MG tablet Take 1 tablet by mouth 2 times daily 180 tablet 2       Past Medical History:   Diagnosis Date    B12 deficiency     Cancer [facility]) Summer 2020    Skin    Cold sore     Headache     Hearing loss A year or so ago    Fluid in ears    Primary hypertension     Vitamin D deficiency      No past surgical history on file.  Family History   Problem Relation Age of Onset    No Known Problems Mother     Cancer Father         skin cancer    Parkinson's Disease Father     No Known Problems Sister     No Known Problems Sister     No Known Problems Brother     Breast Cancer Maternal Grandmother     Cancer Maternal Grandmother         breast cancer    Heart Attack Maternal Grandmother     Alzheimer's Disease Maternal Grandfather     Cancer Paternal Grandmother     Breast Cancer Paternal Aunt     Heart Attack Maternal Aunt     Stroke Maternal Aunt     Ovarian Cancer Neg Hx      Social History     Socioeconomic History    Marital status: Married     Spouse name: Not on file    Number of children: Not on file    Years of education: Not on file    Highest education level: Not on file   Occupational History    Not on file   Tobacco Use    Smoking status: Never    Smokeless tobacco: Never   Substance and Sexual Activity    Alcohol use: Never    Drug use: Never    Sexual activity: Yes     Partners: Male   Other Topics Concern    Not on file   Social History Narrative    Not on file     Social Drivers of Health     Financial Resource Strain: Low Risk  (07/23/2022)    Overall Financial Resource Strain (CARDIA)     Difficulty of Paying Living Expenses: Not hard at all   Food Insecurity: No Food Insecurity (07/27/2023)    Hunger Vital Sign     Worried About Running Out of Food in  the Last Year: Never true     Ran Out of Food in the Last Year: Never true   Transportation Needs: No Transportation Needs (07/27/2023)    PRAPARE - Therapist, art (Medical): No     Lack of Transportation (Non-Medical): No   Physical Activity: Not on file   Stress: Not on file   Social Connections: Not on file   Intimate Partner Violence: Not on file   Housing Stability: Low Risk  (07/27/2023)    Housing Stability Vital Sign     Unable to Pay for Housing in the Last Year: No     Number of Times Moved in the Last Year: 0     Homeless in the Last Year: No       Review of Systems:  A comprehensive review of systems was negative except for what was noted in the HPI.  Physical Exam:   Vitals:    08/16/23 1151 08/16/23 1206   BP: (!) 158/84 (!) 138/98   Pulse: (!) 106    SpO2: 100%    Weight: 66.7 kg (147 lb)    Height: 1.651 m (5\' 5" )      Body mass index is 24.46 kg/m.   Constitutional: She is oriented to person, place, and time. She appears well-developed and well-nourished. No distress.   HEENT:   Head: Normocephalic and atraumatic.   Right Ear: Tympanic membrane, external ear and ear canal normal.   Left Ear: Tympanic membrane, external ear and ear canal normal.   Nose: Nose normal.   Mouth/Throat: Oropharynx is clear and moist, and mucous membranes are normal.  There is no cervical adenopathy.  Eyes: Conjunctivae and extraocular motions are normal. Pupils are equal, round, and reactive to light.   Neck: Neck supple. No mass and no thyromegaly present.   Cardiovascular: Normal rate, regular rhythm, normal heart sounds.  Exam reveals no gallop and no friction rub.  No murmur heard.  Pulmonary/Chest: Effort normal and breath sounds normal. No respiratory distress. She has no wheezes, rhonchi or rales.   Abdominal: Soft, non-tender. Bowel sounds are normal. She exhibits no palpable organomegaly or mass. .  Musculoskeletal: Normal range of motion. She exhibits no edema.   Neurological: She is  alert and oriented.  No cranial nerve deficit. Coordination normal.   Skin: Skin is warm and dry. There is no rash or erythema.   Psychiatric: She has a normal mood and affect. Her speech is normal and behavior is normal. Judgment, cognition and memory are normal.     The patient (or guardian, if applicable) and other individuals in attendance with the patient were advised that Artificial Intelligence will be utilized during this visit to record, process the conversation to generate a clinical note, and support improvement of the AI technology. The patient (or guardian, if applicable) and other individuals in attendance at the appointment consented to the use of AI, including the recording.          -Darlina Rumpf, MD on 08/16/2023    An electronic signature was used to authenticate this note.

## 2023-08-30 ENCOUNTER — Encounter

## 2023-08-30 NOTE — Telephone Encounter (Signed)
 Please advise, thank you.

## 2023-08-31 ENCOUNTER — Encounter: Payer: BLUE CROSS/BLUE SHIELD | Attending: Family Medicine | Primary: Family Medicine

## 2023-08-31 ENCOUNTER — Telehealth
Admit: 2023-08-31 | Discharge: 2023-08-31 | Payer: BLUE CROSS/BLUE SHIELD | Attending: Family Medicine | Primary: Family Medicine

## 2023-08-31 DIAGNOSIS — I10 Essential (primary) hypertension: Secondary | ICD-10-CM

## 2023-08-31 LAB — BASIC METABOLIC PANEL
Anion Gap: 9 (ref 3–16)
BUN: 16 mg/dL (ref 7–20)
CO2: 28 mmol/L (ref 21–32)
Calcium: 9.3 mg/dL (ref 8.3–10.6)
Chloride: 103 mmol/L (ref 99–110)
Creatinine: 0.7 mg/dL (ref 0.6–1.1)
Est, Glom Filt Rate: >90 (ref >60)
Glucose: 79 mg/dL (ref 70–99)
Potassium: 4 mmol/L (ref 3.5–5.1)
Sodium: 140 mmol/L (ref 136–145)

## 2023-08-31 MED ORDER — METOPROLOL SUCCINATE ER 50 MG PO TB24
50 | ORAL_TABLET | Freq: Every day | ORAL | 1 refills | 60.00000 days | Status: DC
Start: 2023-08-31 — End: 2023-11-09

## 2023-08-31 NOTE — Progress Notes (Signed)
 TELEHEALTH EVALUATION -- Audio/Visual   Rhonda Kline (DOB:  11-Feb-1981) has requested to and consented to an audio/video evaluation for the concerns or medical issues discussed below.    Rhonda Kline, was evaluated through a synchronous (real-time) audio-video encounter. The patient (or guardian if applicable) is aware that this is a billable service, which includes applicable co-pays. This Virtual Visit was conducted with patient's (and/or legal guardian's) consent. The visit was conducted pursuant to the emergency declaration under the [employer] and the [employer], 1135 waiver authority and the [employer] Act.  Patient identification was verified, and a caregiver was present when appropriate.   The patient was located at Home: [address]  [place] [facility] [address].   Provider was located at Home (Appt Dept State): [place].        Patient identification was verified at the start of the visit and patient has verbally consented to a telehealth visit: Yes    Total time spent on this encounter: Not billed by time.      Services were provided through a video synchronous discussion virtually to substitute for in-person clinic visit.       Rhonda Kline   Date of Birth:  May 23, 1981    Date of Visit:  08/31/2023    No Known Allergies  Outpatient Medications Marked as Taking for the 08/31/23 encounter (Telemedicine) with Darlina Rumpf, MD   Medication Sig Dispense Refill    metoprolol succinate (TOPROL XL) 50 MG extended release tablet Take 1 tablet by mouth daily 90 tablet 1         There were no vitals filed for this visit.  There is no height or weight on file to calculate BMI.   There were no vitals taken for this visit.       No data to display                 Wt Readings from Last 3 Encounters:   08/16/23 66.7 kg (147 lb)   05/10/23 64.8 kg (142 lb 12.8 oz)   07/28/22 66.2 kg (146 lb)     BP Readings from Last 3 Encounters:   08/16/23 (!)  138/98   05/10/23 130/80   07/23/22 114/72        Chief Complaint   Patient presents with    Hypertension       Assessment/Plan:    Assessment & Plan  Hypertension  - Blood pressure readings slightly above target range (124-149/88-100)  - Heart rate generally in the 90s, with one reading at 89  - Discussed various treatment options  - Patient felt better and had better-controlled blood pressure on metoprolol  - Options: add low dose of metoprolol to current regimen of hydrochlorothiazide 25 mg daily or switch back to metoprolol 50 mg XL once a day  - Patient chose to discontinue hydrochlorothiazide and resume metoprolol  - Will monitor blood pressure closely and report any issues    Headaches - possibly related to stopping the metoprolol.   - Persistent headaches rated 7 out of 10 in severity at their worst. No headache today.  - No improvement with current treatment  - Will resume metoprolol and get eyes checked  - If no improvement, brain imaging will be considered - patient will let me know.   - Advised to monitor headache frequency and severity, and report any changes    Palpitations  - Resolved and not a significant issue recently  -  No further episodes reported since last visit  - Continue to monitor for any recurrence  - No additional treatment required at this time    Vitamin D deficiency  - Currently taking 1000 units of vitamin D daily  - No new symptoms reported  - Continue current supplementation  - Re-evaluate vitamin D levels at next visit    Night sweats  - Resolved  - No recent episodes reported  - Continue to monitor for any recurrence  - No additional treatment required at this time    Health maintenance  - Due for a Pap smear next year, can be done here or at a gynecology office  - Has not seen a gynecologist since moving here  - Plans to get eyes checked in the near future  - Encouraged to schedule an appointment with a gynecologist for routine care    Follow-up  - Virtual follow-up appointment  scheduled for 10/05/2023 at 11:00 AM    Rhonda Kline was seen today for hypertension.    Diagnoses and all orders for this visit:    Essential (primary) hypertension    Tension headache    Vitamin D deficiency    Other orders  -     metoprolol succinate (TOPROL XL) 50 MG extended release tablet; Take 1 tablet by mouth daily          Discussed medications with patient, who voiced understanding of their use and indications. All questions answered.    Return in about 5 weeks (around 10/05/2023) for Blood pressure.        HPI    History of Present Illness  The patient is a 43 year old female who presents today for follow-up of her high blood pressure.    She reports that her blood pressure remains suboptimal despite an increase in hydrochlorothiazide to 25 mg daily for the past few weeks. Blood pressure readings have been between 124-149/88-100, with heart rates generally in the 90s. She has been adhering to a healthy diet and regular exercise regimen, including walking 3 to 6 miles daily, but has not experienced any significant weight loss. Initially, a couple of pounds were lost, likely due to reduced water retention, but no further weight loss has been noted. She expresses a desire to revert to her previous medication regimen, which included metoprolol 50 mg once daily, as she believes it was more effective in controlling her blood pressure.    Persistent headaches are reported, rated as 7 on a scale of 0 to 10, with occasional severe episodes rated as 8. These headaches are often accompanied by blurred peripheral vision, but no associated nausea or sensitivity to light or sound. The location of the pain has shifted from the base of the neck to a more generalized area on both sides. Sleep quality is described as fair, and a slight improvement in night sweats is noted. Headaches are managed with Advil, which provides some relief but does not completely alleviate the pain. A period during the winter involved daily Advil  intake, but this has since been reduced to once a week at most.  She does not have a headache today.     A decrease in palpitations is reported, with only one episode since the last visit.    She is currently taking 1000 units of vitamin D daily.    Plans to get her eyes checked in the near future are mentioned.        Results        Social History  Socioeconomic History    Marital status: Married     Spouse name: Not on file    Number of children: Not on file    Years of education: Not on file    Highest education level: Not on file   Occupational History    Not on file   Tobacco Use    Smoking status: Never    Smokeless tobacco: Never   Substance and Sexual Activity    Alcohol use: Never    Drug use: Never    Sexual activity: Yes     Partners: Male   Other Topics Concern    Not on file   Social History Narrative    Not on file     Social Drivers of Health     Financial Resource Strain: Low Risk  (07/23/2022)    Overall Financial Resource Strain (CARDIA)     Difficulty of Paying Living Expenses: Not hard at all   Food Insecurity: No Food Insecurity (07/27/2023)    Hunger Vital Sign     Worried About Running Out of Food in the Last Year: Never true     Ran Out of Food in the Last Year: Never true   Transportation Needs: No Transportation Needs (07/27/2023)    PRAPARE - Therapist, art (Medical): No     Lack of Transportation (Non-Medical): No   Physical Activity: Not on file   Stress: Not on file   Social Connections: Not on file   Intimate Partner Violence: Not on file   Housing Stability: Low Risk  (07/27/2023)    Housing Stability Vital Sign     Unable to Pay for Housing in the Last Year: No     Number of Times Moved in the Last Year: 0     Homeless in the Last Year: No       Review of Systems  As documented in the HPI. Currently no complaints of CP or DIB.     Physical Exam      Physical Exam  Vitals reviewed: Mental Status: The patient is awake and alert.   Constitutional:        General: She is not in acute distress.     Appearance: Normal appearance. She is not ill-appearing.   HENT:      Head: Normocephalic and atraumatic.      Right Ear: External ear normal.      Left Ear: External ear normal.      Nose: Nose normal.      Mouth/Throat:      Mouth: Mucous membranes are moist.   Eyes:      General:         Right eye: No discharge.         Left eye: No discharge.      Extraocular Movements: Extraocular movements intact.   Neck:      Comments: No visualized neck masses.  Pulmonary:      Effort: Pulmonary effort is normal. No respiratory distress.   Musculoskeletal:         General: Normal range of motion.      Cervical back: Normal range of motion.      Comments: Nl ROM of the Neck.    Skin:     General: Skin is dry.      Coloration: Skin is not pale.      Comments: Skin without any obvious and significant discoloration or abnormalities.    Neurological:  Mental Status: She is alert. Mental status is at baseline.      Comments: Cranial nerves are grossly intact. No visible facial asymmetry.    Psychiatric:         Mood and Affect: Mood normal.         Behavior: Behavior normal.         Thought Content: Thought content normal.         Judgment: Judgment normal.                   The patient (or guardian, if applicable) and other individuals in attendance with the patient were advised that Artificial Intelligence will be utilized during this visit to record, process the conversation to generate a clinical note, and support improvement of the AI technology. The patient (or guardian, if applicable) and other individuals in attendance at the appointment consented to the use of AI, including the recording.        -Darlina Rumpf, MD on 08/31/2023    An electronic signature was used to authenticate this note.

## 2023-09-19 ENCOUNTER — Encounter

## 2023-09-20 MED ORDER — LEVONORGEST-ETH ESTRAD 91-DAY 0.15-0.03 MG PO TABS
0.15-0.03 | ORAL_TABLET | Freq: Every day | ORAL | 2 refills | Status: AC
Start: 2023-09-20 — End: ?

## 2023-09-22 ENCOUNTER — Encounter

## 2023-09-22 MED ORDER — VALACYCLOVIR HCL 500 MG PO TABS
500 | ORAL_TABLET | Freq: Two times a day (BID) | ORAL | 1 refills | 30.00000 days | Status: AC
Start: 2023-09-22 — End: ?

## 2023-09-22 NOTE — Telephone Encounter (Signed)
 Medication:   Requested Prescriptions     Pending Prescriptions Disp Refills    valACYclovir  (VALTREX ) 500 MG tablet [Pharmacy Med Name: VALACYCLOVIR  HCL 500 MG TABLET] 180 tablet 1     Sig: TAKE 1 TABLET BY MOUTH TWICE A DAY        Last Filled:  07/23/2022 #180 2rf    Patient Phone Number: [phone] (home)     Last appt: 08/31/2023   Next appt: 10/05/2023    Last OARRS:        No data to display

## 2023-10-05 ENCOUNTER — Encounter: Payer: BLUE CROSS/BLUE SHIELD | Attending: Family Medicine | Primary: Family Medicine

## 2023-11-01 NOTE — Telephone Encounter (Signed)
**Note De-identified  [place] Obfuscation** Please advise 

## 2023-11-02 NOTE — Telephone Encounter (Signed)
 Please call the patient to help them schedule an appointment if they would like regarding the issue they mentioned in their my chart message. Thank you.   See if she can do a VV today at 12:40

## 2023-11-02 NOTE — Telephone Encounter (Signed)
 Please can you contact patient to schedule an appointment; thank you!

## 2023-11-02 NOTE — Telephone Encounter (Signed)
 Left voicemail to callback, please schedule

## 2023-11-03 NOTE — Telephone Encounter (Signed)
 Patient scheduled.    Recent Visits  Date Type Provider Dept   08/16/23 Office Visit Larita Pluck, MD [facility]   05/10/23 Office Visit Larita Pluck, MD [facility]   07/23/22 Office Visit Larita Pluck, MD [facility]   Showing recent visits within past 540 days with a meds authorizing provider and meeting all other requirements  Future Appointments  Date Type Provider Dept   11/09/23 Appointment Larita Pluck, MD [facility]   Showing future appointments within next 150 days with a meds authorizing provider and meeting all other requirements

## 2023-11-09 ENCOUNTER — Telehealth
Admit: 2023-11-09 | Discharge: 2023-11-09 | Payer: BLUE CROSS/BLUE SHIELD | Attending: Family Medicine | Primary: Family Medicine

## 2023-11-09 DIAGNOSIS — I10 Essential (primary) hypertension: Secondary | ICD-10-CM

## 2023-11-09 MED ORDER — METOPROLOL SUCCINATE ER 25 MG PO TB24
25 | ORAL_TABLET | Freq: Every day | ORAL | 1 refills | 60.00000 days | Status: AC
Start: 2023-11-09 — End: ?

## 2023-11-09 MED ORDER — HYDROCHLOROTHIAZIDE 12.5 MG PO CAPS
12.5 | ORAL_CAPSULE | Freq: Every morning | ORAL | 1 refills | 30.00000 days | Status: AC
Start: 2023-11-09 — End: ?

## 2023-11-09 NOTE — Progress Notes (Signed)
 TELEHEALTH EVALUATION -- Audio/Visual   Rhonda Kline (DOB:  09/15/1980) has requested to and consented to an audio/video evaluation for the concerns or medical issues discussed below.    Rhonda Kline, was evaluated through a synchronous (real-time) audio-video encounter. The patient (or guardian if applicable) is aware that this is a billable service, which includes applicable co-pays. This Virtual Visit was conducted with patient's (and/or legal guardian's) consent. The visit was conducted pursuant to the emergency declaration under the [employer] and the [employer], 1135 waiver authority and the [employer] Act.  Patient identification was verified, and a caregiver was present when appropriate.   The patient was located at Other: car .   Provider was located at Home (Appt Dept State): [place].        Patient identification was verified at the start of the visit and patient has verbally consented to a telehealth visit: Yes    Total time spent on this encounter: Not billed by time.      Services were provided through a video synchronous discussion virtually to substitute for in-person clinic visit.       Rhonda Kline   Date of Birth:  1981/05/17    Date of Visit:  11/09/2023    No Known Allergies  Outpatient Medications Marked as Taking for the 11/09/23 encounter (Telemedicine) with Larita Pluck, MD   Medication Sig Dispense Refill    metoprolol  succinate (TOPROL  XL) 25 MG extended release tablet Take 1 tablet by mouth daily 90 tablet 1    hydroCHLOROthiazide  12.5 MG capsule Take 1 capsule by mouth every morning 90 capsule 1    valACYclovir  (VALTREX ) 500 MG tablet TAKE 1 TABLET BY MOUTH TWICE A DAY 180 tablet 1    levonorgestrel-ethinyl estradiol (SEASONALE) 0.15-0.03 MG per tablet TAKE 1 TABLET BY MOUTH EVERY DAY 91 tablet 2         There were no vitals filed for this visit.  There is no height or weight on file to calculate BMI.   There were  no vitals taken for this visit.       No data to display                 Wt Readings from Last 3 Encounters:   08/16/23 66.7 kg (147 lb)   05/10/23 64.8 kg (142 lb 12.8 oz)   07/28/22 66.2 kg (146 lb)     BP Readings from Last 3 Encounters:   08/16/23 (!) 138/98   05/10/23 130/80   07/23/22 114/72        Chief Complaint   Patient presents with    Hypertension     Medication    114/84       Assessment/Plan:    Assessment & Plan  Hypertension:  - Blood pressure has improved on metoprolol  but patient feels sluggish.  - Metoprolol  dose will be decreased to metoprolol  XL 25 mg daily, and hydrochlorothiazide  12.5 mg daily will be added.  - Advised to monitor blood pressure regularly and report any issues.  - Basic metabolic panel to be rechecked in a few weeks to monitor for potential side effects of the new regimen.    Palpitations:  - Palpitations have significantly improved on metoprolol .  - Plan includes continuing metoprolol  at a reduced dose of 25 mg daily.    Vitamin D deficiency:  - Vitamin D levels are stable.  - Patient can choose to resume vitamin D supplementation immediately or wait  until after the summer to restart.    Headaches:  - Headaches have shown improvement.    Follow-up:  - Follow up in 3 months.    Return in about 3 months (around 02/09/2024) for Blood pressure.      Rhonda Kline was seen today for hypertension.    Diagnoses and all orders for this visit:    Essential (primary) hypertension  -     Basic Metabolic Panel; Future    Palpitations    Vitamin D deficiency    Other orders  -     metoprolol  succinate (TOPROL  XL) 25 MG extended release tablet; Take 1 tablet by mouth daily  -     hydroCHLOROthiazide  12.5 MG capsule; Take 1 capsule by mouth every morning          Discussed medications with patient, who voiced understanding of their use and indications. All questions answered.        HPI    History of Present Illness  The patient is a 43 year old female who presents via virtual visit for follow-up  of hypertension.    She has been actively managing her health, achieving an average of 15,000 steps daily and maintaining a balanced diet. However, she reports that metoprolol  induces lethargy and impedes weight loss, while hydrochlorothiazide  appears to enhance her overall well-being. Despite its benefits, hydrochlorothiazide  was not as effective in controlling her blood pressure as metoprolol . Her blood pressure readings have been stable, with a reading of 109/80 last week and 114/84 this morning. She has been on metoprolol  50 mg extended release.    She also reports an improvement in her headaches and palpitations since resuming metoprolol .    She has completed her course of vitamin D supplements and has not yet refilled the prescription.    She has experienced some visual changes and increased dizziness, but these symptoms have since subsided. She plans to schedule appointments with dermatology, ophthalmology, and for a mammogram.        Results        Social History     Socioeconomic History    Marital status: Married     Spouse name: Not on file    Number of children: Not on file    Years of education: Not on file    Highest education level: Not on file   Occupational History    Not on file   Tobacco Use    Smoking status: Never    Smokeless tobacco: Never   Substance and Sexual Activity    Alcohol use: Never    Drug use: Never    Sexual activity: Yes     Partners: Male   Other Topics Concern    Not on file   Social History Narrative    Not on file     Social Drivers of Health     Financial Resource Strain: Low Risk  (07/23/2022)    Overall Financial Resource Strain (CARDIA)     Difficulty of Paying Living Expenses: Not hard at all   Food Insecurity: No Food Insecurity (07/27/2023)    Hunger Vital Sign     Worried About Running Out of Food in the Last Year: Never true     Ran Out of Food in the Last Year: Never true   Transportation Needs: No Transportation Needs (07/27/2023)    PRAPARE - Tourist information centre manager (Medical): No     Lack of Transportation (Non-Medical): No   Physical Activity: Not on  file   Stress: Not on file   Social Connections: Not on file   Intimate Partner Violence: Not on file   Housing Stability: Low Risk  (07/27/2023)    Housing Stability Vital Sign     Unable to Pay for Housing in the Last Year: No     Number of Times Moved in the Last Year: 0     Homeless in the Last Year: No       Review of Systems  As documented in the HPI. Currently no complaints of CP or DIB.     Physical Exam      Physical Exam  Constitutional:       General: She is not in acute distress.     Appearance: Normal appearance. She is not ill-appearing.   HENT:      Head: Normocephalic and atraumatic.      Nose: Nose normal.   Pulmonary:      Effort: Pulmonary effort is normal. No respiratory distress.   Neurological:      General: No focal deficit present.      Mental Status: She is alert.   Psychiatric:         Mood and Affect: Mood normal.         Behavior: Behavior normal.                   The patient (or guardian, if applicable) and other individuals in attendance with the patient were advised that Artificial Intelligence will be utilized during this visit to record, process the conversation to generate a clinical note, and support improvement of the AI technology. The patient (or guardian, if applicable) and other individuals in attendance at the appointment consented to the use of AI, including the recording.        -Larita Pluck, MD on 11/09/2023    An electronic signature was used to authenticate this note.

## 2023-11-18 ENCOUNTER — Inpatient Hospital Stay: Admit: 2023-11-18 | Payer: BLUE CROSS/BLUE SHIELD | Primary: Family Medicine

## 2023-11-18 ENCOUNTER — Encounter

## 2023-11-18 VITALS — Ht 65.0 in | Wt 150.0 lb

## 2023-11-18 DIAGNOSIS — Z1231 Encounter for screening mammogram for malignant neoplasm of breast: Secondary | ICD-10-CM

## 2023-12-01 MED ORDER — METOPROLOL SUCCINATE ER 50 MG PO TB24
50 | ORAL_TABLET | Freq: Every day | ORAL | 1 refills | Status: AC
Start: 2023-12-01 — End: ?

## 2023-12-01 NOTE — Telephone Encounter (Signed)
 Medication:   Requested Prescriptions     Pending Prescriptions Disp Refills    metoprolol  succinate (TOPROL  XL) 50 MG extended release tablet [Pharmacy Med Name: METOPROLOL  SUCC ER 50 MG TAB] 90 tablet 1     Sig: TAKE 1 TABLET BY MOUTH EVERY DAY        Last Filled:  05/10/2023 90 tabs 1 refill     Patient Phone Number: [phone] (home)     Last appt: 11/09/2023   Next appt: Visit date not found    Last OARRS:        No data to display

## 2024-04-30 ENCOUNTER — Encounter

## 2024-05-01 NOTE — Telephone Encounter (Signed)
"  Due for appt, sent mychart message  "

## 2024-05-03 NOTE — Telephone Encounter (Signed)
"  Left voicemail to callback   "

## 2024-05-03 NOTE — Telephone Encounter (Signed)
Please check the dose

## 2024-05-03 NOTE — Telephone Encounter (Signed)
"  Medication:   Requested Prescriptions     Pending Prescriptions Disp Refills    hydroCHLOROthiazide  12.5 MG capsule [Pharmacy Med Name: HYDROCHLOROTHIAZIDE  12.5 MG CP] 90 capsule 1     Sig: TAKE 1 CAPSULE BY MOUTH EVERY DAY IN THE MORNING    metoprolol  succinate (TOPROL  XL) 25 MG extended release tablet [Pharmacy Med Name: METOPROLOL  SUCC ER 25 MG TAB] 90 tablet 1     Sig: TAKE 1 TABLET BY MOUTH EVERY DAY        Last Filled:  11/09/2023 390 1rf     Patient Phone Number: [phone] (home)     Last appt: 11/09/2023   Next appt: Visit date not found    Last OARRS:        No data to display                  "

## 2024-05-08 ENCOUNTER — Ambulatory Visit (INDEPENDENT_AMBULATORY_CARE_PROVIDER_SITE_OTHER): Admitting: Family Medicine

## 2024-05-08 ENCOUNTER — Encounter: Payer: Self-pay | Admitting: Family Medicine

## 2024-05-08 VITALS — BP 117/73 | HR 75 | Ht 65.0 in | Wt 150.5 lb

## 2024-05-08 DIAGNOSIS — M79672 Pain in left foot: Secondary | ICD-10-CM

## 2024-05-08 DIAGNOSIS — M79671 Pain in right foot: Secondary | ICD-10-CM | POA: Diagnosis not present

## 2024-05-08 DIAGNOSIS — G8929 Other chronic pain: Secondary | ICD-10-CM

## 2024-05-08 DIAGNOSIS — B001 Herpesviral vesicular dermatitis: Secondary | ICD-10-CM | POA: Diagnosis not present

## 2024-05-08 DIAGNOSIS — Z3041 Encounter for surveillance of contraceptive pills: Secondary | ICD-10-CM

## 2024-05-08 DIAGNOSIS — Z85828 Personal history of other malignant neoplasm of skin: Secondary | ICD-10-CM

## 2024-05-08 DIAGNOSIS — I10 Essential (primary) hypertension: Secondary | ICD-10-CM | POA: Diagnosis not present

## 2024-05-08 MED ORDER — LOSARTAN POTASSIUM 50 MG PO TABS: 50.0000 mg | ORAL_TABLET | Freq: Every day | ORAL | 1 refills | Status: DC

## 2024-05-08 MED ORDER — VALACYCLOVIR HCL 500 MG PO TABS: 500.0000 mg | ORAL_TABLET | Freq: Every day | ORAL | 1 refills | Status: AC

## 2024-05-08 MED ORDER — LEVONORGEST-ETH ESTRAD 91-DAY 0.15-0.03 MG PO TABS: 1.0000 | ORAL_TABLET | Freq: Every day | ORAL | 4 refills | Status: DC

## 2024-05-08 MED ORDER — LEVONORGEST-ETH ESTRAD 91-DAY 0.15-0.03 MG PO TABS: 1.0000 | ORAL_TABLET | Freq: Every day | ORAL | 3 refills | Status: DC

## 2024-05-08 MED ORDER — HYDROCHLOROTHIAZIDE 12.5 MG PO CAPS: 12.5000 mg | ORAL_CAPSULE | Freq: Every day | ORAL | 1 refills | Status: AC

## 2024-05-08 NOTE — Progress Notes (Signed)
 New patient visit   Patient: Pamela Daniels   DOB: 1981-01-08   43 y.o. Female  MRN: [id number] Visit Date: 05/08/2024  Today's healthcare provider: Jon Eva, MD   Chief Complaint  Patient presents with   New Patient (Initial Visit)    Patient has returned from [place]  and would like to reestablish care and discuss rise in blood pressure. She also reports bilateral leg pain but right is worse. Mammogram completed in [place]    Hypertension    She reports she has been doubling her dose for the last 3 weeks so she is now taking 50 mg. She reports monitoring at home at least once a week (mainly when feel it going up). She reports feeling like this tension in her neck. She reports symptoms of lower leg edema and feels some light headedness at times. She does not smoke. She does consume a low salt diet.    Subjective    Pamela Daniels is a 43 y.o. female who presents today as a new patient to establish care.   Discussed the use of AI scribe software for clinical note transcription with the patient, who gave verbal consent to proceed.  History of Present Illness   Pamela Daniels is a 43 year old female with hypertension who presents for management of her blood pressure and leg pain.  She has long-standing hypertension that has been difficult to control. Labetalol  was discontinued and changed to metoprolol. Metoprolol 50 mg caused rapid weight gain and lethargy, so she was switched to hydrochlorothiazide , which did not adequately control her blood pressure. Combination therapy helped briefly, but her diastolic pressures often reach 100. On metoprolol she feels sluggish and gains weight, and when her blood pressure is high she has lightheadedness and occasional heart racing. She is currently taking metoprolol 50 mg and hydrochlorothiazide  12.5 mg daily.  She has chronic aching leg pain, worse in the right leg, present since her teenage years and recently more intense, now keeping her  awake at night. She notes "weird bumps" on her shins and more pain on the right than left. She has prior foot pain and uses supportive sneakers, which help, and avoids walking barefoot.  She has night sweats, which she thinks may relate to her blood pressure. She uses birth control pills and Valtrex  as needed for cold sores. She has multiple prior basal cell carcinomas and follows regularly with dermatology.        Past Medical History:  Diagnosis Date   Allergy    Hypertension    skin cancer Fall 2019   Skin Cancer - no chemo, no melanoma   Past Surgical History:  Procedure Laterality Date   MOHS SURGERY     Family Status  Relation Name Status   Mother Heidi Alive   Father Pat Alive   Sister Warren Alive   MGM Becky Alive   MGF Gramp Deceased   PGM  Deceased   PGF  Deceased   Daughter Rogers Alive   Daughter lola Alive   Mat Aunt Aunt Jan Alive   Pat Aunt Colleen Deceased  No partnership data on file   Family History  Problem Relation Age of Onset   Cancer Mother        skin   Hearing loss Mother    Varicose Veins Mother    Glaucoma Father    Sleep apnea Father    Skin cancer Father    Depression Father    Parkinson's disease Father  Varicose Veins Sister    Breast cancer Maternal Grandmother        late yrs   Stroke Maternal Grandmother    Varicose Veins Maternal Grandmother    Heart attack Maternal Grandmother    Hearing loss Maternal Grandfather    Lung cancer Paternal Grandmother    Asthma Daughter    Asthma Daughter    Stroke Maternal Aunt    Breast cancer Paternal Aunt        50s/60s   Stroke Paternal Aunt    Social History   Socioeconomic History   Marital status: Married    Spouse name: Not on file   Number of children: 2   Years of education: Not on file   Highest education level: Not on file  Occupational History   Not on file  Tobacco Use   Smoking status: Never   Smokeless tobacco: Never  Vaping Use   Vaping status: Never Used   Substance and Sexual Activity   Alcohol use: Never   Drug use: Never   Sexual activity: Yes    Partners: Male    Birth control/protection: Pill    Comment: husband had vasectomy  Other Topics Concern   Not on file  Social History Narrative   Not on file   Social Drivers of Health   Financial Resource Strain: Low Risk  (07/23/2022)   Received from [facility]   Overall Financial Resource Strain (CARDIA)    Difficulty of Paying Living Expenses: Not hard at all  Food Insecurity: No Food Insecurity (07/27/2023)   Received from [facility]   Hunger Vital Sign    Within the past 12 months, you worried that your food would run out before you got the money to buy more.: Never true    Within the past 12 months, the food you bought just didn't last and you didn't have money to get more.: Never true  Transportation Needs: No Transportation Needs (07/27/2023)   Received from [facility]   PRAPARE - Administrator, Civil Service (Medical): No    Lack of Transportation (Non-Medical): No  Physical Activity: Not on file  Stress: Not on file  Social Connections: Not on file   Outpatient Medications Prior to Visit  Medication Sig   albuterol (VENTOLIN HFA) 108 (90 Base) MCG/ACT inhaler Inhale 2 puffs into the lungs.   [DISCONTINUED] cetirizine (ZYRTEC) 10 MG tablet Take 10 mg by mouth daily.   [DISCONTINUED] hydrochlorothiazide  (MICROZIDE ) 12.5 MG capsule Take 12.5 mg by mouth daily.   [DISCONTINUED] labetalol  (NORMODYNE ) 100 MG tablet Take 1 tablet (100 mg total) by mouth 2 (two) times daily. OFFICE VISIT NEEDED FOR ADDITIONAL REFILLS   [DISCONTINUED] levonorgestrel -ethinyl estradiol  (SEASONALE) 0.15-0.03 MG tablet Take 1 tablet by mouth daily.   [DISCONTINUED] metoprolol succinate (TOPROL-XL) 50 MG 24 hr tablet Take 50 mg by mouth daily.   [DISCONTINUED] valACYclovir  (VALTREX ) 500 MG tablet TAKE 1 TABLET BY MOUTH  TWICE A DAY   No facility-administered medications prior to visit.   No Known Allergies  Review of Systems      Objective    BP 117/73 (BP Location: Left Arm, Patient Position: Sitting, Cuff Size: Normal)   Pulse 75   Ht 5' 5 (1.651 m)   Wt 150 lb 8 oz (68.3 kg)   SpO2 100%   BMI 25.04 kg/m     Physical Exam Vitals reviewed.  Constitutional:      General:  She is not in acute distress.    Appearance: Normal appearance. She is well-developed. She is not diaphoretic.  HENT:     Head: Normocephalic and atraumatic.  Eyes:     General: No scleral icterus.    Conjunctiva/sclera: Conjunctivae normal.  Neck:     Thyroid: No thyromegaly.  Cardiovascular:     Rate and Rhythm: Normal rate and regular rhythm.     Heart sounds: Normal heart sounds. No murmur heard. Pulmonary:     Effort: Pulmonary effort is normal. No respiratory distress.     Breath sounds: Normal breath sounds. No wheezing, rhonchi or rales.  Musculoskeletal:     Cervical back: Neck supple.     Right lower leg: No edema.     Left lower leg: No edema.     Comments: Lipomas on shin of RLE TTP b/l over soleus muscle at junction of gastroc Negative homans  Lymphadenopathy:     Cervical: No cervical adenopathy.  Skin:    General: Skin is warm and dry.     Findings: No rash.  Neurological:     Mental Status: She is alert and oriented to person, place, and time. Mental status is at baseline.  Psychiatric:        Mood and Affect: Mood normal.        Behavior: Behavior normal.     Depression Screen    10/03/2020    9:40 AM 07/04/2020   11:00 AM 04/30/2020   11:46 AM 09/27/2019   10:16 AM  PHQ 2/9 Scores  PHQ - 2 Score 0 0 2 0  PHQ- 9 Score 2  2  6   0      Data saved with a previous flowsheet row definition   No results found for any visits on 05/08/24.  Assessment & Plan      Problem List Items Addressed This Visit       Cardiovascular and Mediastinum   Hypertension - Primary   Hypertension  management is suboptimal with current regimen of metoprolol and hydrochlorothiazide . Metoprolol causes weight gain, sluggishness, and lethargy. Hydrochlorothiazide  alone is insufficient for blood pressure control. Blood pressure remains elevated, particularly diastolic pressure. No significant palpitations or AFib. Previous use of labetalol  and metoprolol noted. Discussed switching to losartan , an ACE inhibitor, to avoid beta blocker side effects. Losartan  is well tolerated, does not cause diuresis, and is unlikely to cause sluggishness. Discussed rare risk of angioedema with losartan  and advised emergency room visit if symptoms occur. Plan to monitor potassium levels due to potential increase with losartan . - Discontinued metoprolol. - Initiated losartan  50 mg daily. - Continued hydrochlorothiazide  12.5 mg daily. - Ordered potassium level check in 1-2 weeks. - Scheduled follow-up in 1 month to reassess blood pressure and medication efficacy.      Relevant Medications   losartan  (COZAAR ) 50 MG tablet   hydrochlorothiazide  (MICROZIDE ) 12.5 MG capsule   Other Relevant Orders   Basic Metabolic Panel (BMET)   Other Visit Diagnoses       Encounter for surveillance of contraceptive pills       Relevant Medications   levonorgestrel -ethinyl estradiol  (SEASONALE) 0.15-0.03 MG tablet     Cold sore       Relevant Medications   valACYclovir  (VALTREX ) 500 MG tablet     Chronic foot pain, right         Chronic foot pain, left         History of basal cell carcinoma       Relevant  Medications   valACYclovir  (VALTREX ) 500 MG tablet           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 Pain affects sleep and is associated with swelling, particularly in the right leg. No signs of blood clots. Pain likely referred from foot issues affecting gait and calf muscles. Lipomas present but not the primary cause of pain. - Provided  stretching exercises for calf muscles. - Advised on supportive footwear and avoiding walking barefoot. - Encouraged regular stretching and walking, especially during travel.  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 - Continue Valtrex  500 mg daily.  Contraceptive management (oral) Currently on oral contraceptive Seasonal for hormone balance and contraception. Husband has had a vasectomy, providing additional contraceptive assurance. - Continue Seasonal oral contraceptive.  History of basal cell carcinoma of skin Multiple basal cell carcinomas, with the most recent excision being deep. Regular dermatology follow-ups in place. - Continue regular dermatology follow-ups.       Return in about 4 weeks (around 06/05/2024) for BP f/u.      Jon Eva, MD  [facility] [phone] (phone) [phone] (fax)  [facility]

## 2024-05-08 NOTE — Telephone Encounter (Signed)
"  Confirm dosing and schedule f/u appt as due for BP.   "

## 2024-05-08 NOTE — Telephone Encounter (Signed)
 Left voicemail to call back to confirm dose

## 2024-05-08 NOTE — Assessment & Plan Note (Signed)
 Hypertension management is suboptimal with current regimen of metoprolol and hydrochlorothiazide . Metoprolol causes weight gain, sluggishness, and lethargy. Hydrochlorothiazide  alone is insufficient for blood pressure control. Blood pressure remains elevated, particularly diastolic pressure. No significant palpitations or AFib. Previous use of labetalol  and metoprolol noted. Discussed switching to losartan , an ACE inhibitor, to avoid beta blocker side effects. Losartan  is well tolerated, does not cause diuresis, and is unlikely to cause sluggishness. Discussed rare risk of angioedema with losartan  and advised emergency room visit if symptoms occur. Plan to monitor potassium levels due to potential increase with losartan . - Discontinued metoprolol. - Initiated losartan  50 mg daily. - Continued hydrochlorothiazide  12.5 mg daily. - Ordered potassium level check in 1-2 weeks. - Scheduled follow-up in 1 month to reassess blood pressure and medication efficacy.

## 2024-05-09 ENCOUNTER — Other Ambulatory Visit: Payer: Self-pay

## 2024-05-09 DIAGNOSIS — Z3041 Encounter for surveillance of contraceptive pills: Secondary | ICD-10-CM

## 2024-05-09 MED ORDER — LEVONORGEST-ETH ESTRAD 91-DAY 0.15-0.03 MG PO TABS
1.0000 | ORAL_TABLET | Freq: Every day | ORAL | 4 refills | Status: AC
  Filled 2024-05-09: qty 91, 91d supply, fill #0

## 2024-05-09 NOTE — Telephone Encounter (Signed)
"  Patient moved to [facility] & has a new physician she sees out there.     They prescribe all her medications.     No further action is needed at this time; thank you!    "

## 2024-05-09 NOTE — Telephone Encounter (Signed)
"  Left voicemail to callback   "

## 2024-05-19 ENCOUNTER — Ambulatory Visit: Payer: Self-pay | Admitting: Family Medicine

## 2024-05-19 LAB — BASIC METABOLIC PANEL WITH GFR
BUN/Creatinine Ratio: 17 (ref 9–23)
BUN: 12 mg/dL (ref 6–24)
CO2: 22 mmol/L (ref 20–29)
Calcium: 9.7 mg/dL (ref 8.7–10.2)
Chloride: 98 mmol/L (ref 96–106)
Creatinine, Ser: 0.71 mg/dL (ref 0.57–1.00)
Glucose: 82 mg/dL (ref 70–99)
Potassium: 4 mmol/L (ref 3.5–5.2)
Sodium: 138 mmol/L (ref 134–144)
eGFR: 108 mL/min/1.73

## 2024-05-29 ENCOUNTER — Encounter: Admitting: Family Medicine

## 2024-05-30 ENCOUNTER — Other Ambulatory Visit: Payer: Self-pay | Admitting: Family Medicine

## 2024-06-13 ENCOUNTER — Ambulatory Visit: Admitting: Family Medicine

## 2024-06-14 NOTE — Telephone Encounter (Signed)
 Medication:   Requested Prescriptions     Pending Prescriptions Disp Refills    metoprolol  succinate (TOPROL  XL) 50 MG extended release tablet [Pharmacy Med Name: METOPROLOL  SUCC ER 50 MG TAB] 90 tablet 1     Sig: TAKE 1 TABLET BY MOUTH EVERY DAY        Last Filled:  12/01/2023 90 tabs 1 refill no longer our patient     Patient Phone Number: [phone] (home)     Last appt: 11/09/2023   Next appt: Visit date not found    Last OARRS:        No data to display

## 2024-07-05 ENCOUNTER — Encounter: Payer: Self-pay | Admitting: Family Medicine

## 2024-07-05 ENCOUNTER — Ambulatory Visit: Payer: Self-pay

## 2024-07-05 NOTE — Telephone Encounter (Signed)
 FYI Only or Action Required?: FYI only for provider: appointment scheduled on 07/06/24.  Patient was last seen in primary care on 05/08/2024 by Myrla Jon HERO, MD.  Called Nurse Triage reporting Fatigue.  Symptoms began about a month ago.  Interventions attempted: Nothing.  Symptoms are: unchanged.  Triage Disposition: See Physician Within 24 Hours  Patient/caregiver understands and will follow disposition?:   Reason for Disposition  [1] MODERATE weakness (e.g., interferes with work, school, normal activities) AND [2] persists > 3 days  Answer Assessment - Initial Assessment Questions Pt calling to report ongoing fatigue. Pt states she had surgery 06/03/24, and has been feeling fatigued everyday since. Pt sates she has been tired and lethargic everyday and takes a nap every afternoon and sleeps through the night (unusual per pt), body aches, headaches, some nausea.  Pt scheduled for OV 07/06/24.  1. DESCRIPTION: Describe how you are feeling.     Very tired and fatigued 2. SEVERITY: How bad is it?  Can you stand and walk?     Yes  3. ONSET: When did these symptoms begin? (e.g., hours, days, weeks, months)     Since surgery on 06/03/24 - fatigue has not improved. 4. CAUSE: What do you think is causing the weakness or fatigue? (e.g., not drinking enough fluids, medical problem, trouble sleeping)     Pt unsure, wants labs redone  5. NEW MEDICINES:  Have you started on any new medicines recently? (e.g., opioid pain medicines, benzodiazepines, muscle relaxants, antidepressants, antihistamines, neuroleptics, beta blockers)     No  6. OTHER SYMPTOMS: Do you have any other symptoms? (e.g., chest pain, fever, cough, SOB, vomiting, diarrhea, bleeding, other areas of pain)     Denies  Protocols used: Weakness (Generalized) and Fatigue-A-AH  Reason for Triage: Patient reports severe fatigue, abdominal pain, and headaches. She recently had emergency surgery, and her lab work has  been abnormal. Due to her current symptoms, the patient is requesting repeat lab work

## 2024-07-06 ENCOUNTER — Encounter: Payer: Self-pay | Admitting: Physician Assistant

## 2024-07-06 ENCOUNTER — Ambulatory Visit: Admitting: Physician Assistant

## 2024-07-06 VITALS — BP 132/92 | HR 84 | Ht 65.0 in | Wt 147.2 lb

## 2024-07-06 DIAGNOSIS — Z9889 Other specified postprocedural states: Secondary | ICD-10-CM | POA: Diagnosis not present

## 2024-07-06 DIAGNOSIS — I10 Essential (primary) hypertension: Secondary | ICD-10-CM | POA: Diagnosis not present

## 2024-07-06 DIAGNOSIS — R5383 Other fatigue: Secondary | ICD-10-CM | POA: Insufficient documentation

## 2024-07-06 NOTE — Telephone Encounter (Signed)
 Needs a follow up appt from her hospitalization so we can go over it and recheck what is needed

## 2024-07-06 NOTE — Progress Notes (Signed)
 " Established patient visit  Patient: Pamela Daniels   DOB: 1981-04-04   44 y.o. Female  MRN: [id number] Visit Date: 07/06/2024  Today's healthcare provider: Jolynn Spencer, PA-C   Chief Complaint  Patient presents with   Fatigue     Pt states she had surgery 06/03/24, and has been feeling fatigued everyday since. Pt sates she has been tired and lethargic everyday and takes a nap every afternoon and sleeps through the night (unusual per pt), body aches, headaches, some nausea, abdominal pain. Symptoms began about a month ago. Patient would like labs to be repeated.   Subjective     HPI     Fatigue    Additional comments:  Pt states she had surgery 06/03/24, and has been feeling fatigued everyday since. Pt sates she has been tired and lethargic everyday and takes a nap every afternoon and sleeps through the night (unusual per pt), body aches, headaches, some nausea, abdominal pain. Symptoms began about a month ago. Patient would like labs to be repeated.      Last edited by Rosas, Joseline E, CMA on 07/06/2024 10:02 AM.       Discussed the use of AI scribe software for clinical note transcription with the patient, who gave verbal consent to proceed.  History of Present Illness Pamela Daniels is a 44 year old female who presents with persistent fatigue following recent abdominal surgery on 06/03/24.  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 Pathology was non-cancerous but with regrowth risk, and she is scheduled for surveillance imaging in July.  Since surgery she has had marked fatigue and feels so tired. She was found to have low hemoglobin, hematocrit, and red blood cell counts in the hospital and has not had follow-up labs. She has past low iron and B12 levels and received B12 injections 4-5 years ago. She denies blood in stool or urine, fever, or recent  infections. She notes feeling alternately very cold or very hot.  She has hypertension but has not needed her blood pressure medication since surgery because her readings have been lower. Current medications include nightly Valtrex  for cold sores and a birth control pill.  She has some dizziness but no significant sleep disturbance, anxiety, or depression. Diet and hydration are normal with mostly water and one cup of coffee daily. She notes groin soreness she attributes to surgery and irregular bowel movements with alternating diarrhea and constipation since surgery.  Her menstrual periods are normal. She is not currently sexually active. She works from home, which helps her manage her fatigue.       07/06/2024   10:06 AM 10/03/2020    9:40 AM 07/04/2020   11:00 AM  Depression screen PHQ 2/9  Decreased Interest 0 0 0  Down, Depressed, Hopeless 0 0 0  PHQ - 2 Score 0 0 0  Altered sleeping  1 1  Tired, decreased energy  1 1  Change in appetite  0 0  Feeling bad or failure about yourself   0 0  Trouble concentrating  0 0  Moving slowly or fidgety/restless  0 0  Suicidal thoughts  0 0  PHQ-9 Score  2  2   Difficult doing work/chores  Not difficult at all Not difficult at all     Data saved with a previous flowsheet row definition      07/06/2024  10:06 AM  GAD 7 : Generalized Anxiety Score  Nervous, Anxious, on Edge 0  Control/stop worrying 0  Worry too much - different things 0  Trouble relaxing 0  Restless 0  Easily annoyed or irritable 0  Afraid - awful might happen 0  Total GAD 7 Score 0  Anxiety Difficulty Not difficult at all    Medications: Show/hide medication list[1]  Review of Systems All negative Except see HPI       Objective    BP (!) 132/92 (BP Location: Left Arm, Patient Position: Sitting, Cuff Size: Normal)   Pulse 84   Ht 5' 5 (1.651 m)   Wt 147 lb 3.2 oz (66.8 kg)   SpO2 99%   BMI 24.50 kg/m     Physical Exam Vitals reviewed.   Constitutional:      General: She is not in acute distress.    Appearance: Normal appearance. She is well-developed. She is not diaphoretic.  HENT:     Head: Normocephalic and atraumatic.  Eyes:     General: No scleral icterus.    Conjunctiva/sclera: Conjunctivae normal.  Neck:     Thyroid: No thyromegaly.  Cardiovascular:     Rate and Rhythm: Normal rate and regular rhythm.     Pulses: Normal pulses.     Heart sounds: Normal heart sounds. No murmur heard. Pulmonary:     Effort: Pulmonary effort is normal. No respiratory distress.     Breath sounds: Normal breath sounds. No wheezing, rhonchi or rales.  Musculoskeletal:     Cervical back: Neck supple.     Right lower leg: No edema.     Left lower leg: No edema.  Lymphadenopathy:     Cervical: No cervical adenopathy.  Skin:    General: Skin is warm and dry.     Findings: No rash.  Neurological:     Mental Status: She is alert and oriented to person, place, and time. Mental status is at baseline.  Psychiatric:        Mood and Affect: Mood normal.        Behavior: Behavior normal.      No results found for any visits on 07/06/24.      Assessment and Plan Assessment & Plan Fatigue evaluation Persistent fatigue post-surgery with low hemoglobin and hematocrit. Differential includes vitamin deficiencies, anemia, or postoperative recovery. Enlarged cervical lymph nodes noted on PE BP 132/92 - Ordered B12 and vitamin D levels. - Ordered iron panel. - Ordered Epstein-Barr virus serology. - Consider ultrasound of neck soft tissues if blood work is inconclusive. 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 Monitoring for regrowth. Intermittent diarrhea and irregular bowel movements expected to normalize. Groin pain likely due to surgical recovery. - Continue monitoring with scheduled scans in  July. - Advised on dietary modifications to manage bowel movements. Will follow-up  Hypertension Chronic and unstable Blood pressure low post-surgery, leading to medication discontinuation. Current blood pressure 132/92 mmHg, indicating need for resumption of therapy. - Monitor blood pressure twice daily. - Resume losartan  at half tablet if blood pressure increases. - Adjust medication dosage as needed based on blood pressure readings. Continue lifestyle modifications Will follow-up  General Health Maintenance Stable lifestyle and habits. No smoking or recreational drug use. Normal menstrual cycles. No significant sinus or allergy issues. - Continue current lifestyle and dietary habits. - Ensure adequate hydration and balanced diet.  1. Other  fatigue (Primary)  - CBC with Differential/Platelet - Comprehensive metabolic panel with GFR - TSH - Vitamin B12 - VITAMIN D 25 Hydroxy (Vit-D Deficiency, Fractures) - Fe+TIBC+Fer - Epstein-Barr virus nuclear antigen antibody, IgG  2. Primary hypertension  - CBC with Differential/Platelet - Comprehensive metabolic panel with GFR - TSH  3. S/P gastrointestinal surgery  - CBC with Differential/Platelet - Comprehensive metabolic panel with GFR   No orders of the defined types were placed in this encounter.   No follow-ups on file.   The patient was advised to call back or seek an in-person evaluation if the symptoms worsen or if the condition fails to improve as anticipated.  I discussed the assessment and treatment plan with the patient. The patient was provided an opportunity to ask questions and all were answered. The patient agreed with the plan and demonstrated an understanding of the instructions.  I, Mayia Megill, PA-C have reviewed all documentation for this visit. The documentation on 07/06/2024  for the exam, diagnosis, procedures, and orders are all accurate and complete.  Jolynn Spencer, [facility], MMS [facility] [phone] (phone) [phone] (fax)  [facility]     [1]  Outpatient Medications Prior to Visit  Medication Sig   albuterol (VENTOLIN HFA) 108 (90 Base) MCG/ACT inhaler Inhale 2 puffs into the lungs.   levonorgestrel -ethinyl estradiol  (JOLESSA ) 0.15-0.03 MG tablet Take 1 tablet by mouth daily.   valACYclovir  (VALTREX ) 500 MG tablet Take 1 tablet (500 mg total) by mouth daily.   hydrochlorothiazide  (MICROZIDE ) 12.5 MG capsule Take 1 capsule (12.5 mg total) by mouth daily. (Patient not taking: Reported on 07/06/2024)   losartan  (COZAAR ) 50 MG tablet TAKE 1 TABLET BY MOUTH EVERY DAY (Patient not taking: Reported on 07/06/2024)   No facility-administered medications prior to visit.   "

## 2024-07-06 NOTE — Telephone Encounter (Signed)
 Noted.

## 2024-07-07 LAB — CBC WITH DIFFERENTIAL/PLATELET
Basophils Absolute: 0 10*3/uL (ref 0.0–0.2)
Basos: 1 %
EOS (ABSOLUTE): 0.1 10*3/uL (ref 0.0–0.4)
Eos: 2 %
Hematocrit: 42.8 % (ref 34.0–46.6)
Hemoglobin: 14.2 g/dL (ref 11.1–15.9)
Immature Grans (Abs): 0 10*3/uL (ref 0.0–0.1)
Immature Granulocytes: 0 %
Lymphocytes Absolute: 1.7 10*3/uL (ref 0.7–3.1)
Lymphs: 25 %
MCH: 32.1 pg (ref 26.6–33.0)
MCHC: 33.2 g/dL (ref 31.5–35.7)
MCV: 97 fL (ref 79–97)
Monocytes Absolute: 0.4 10*3/uL (ref 0.1–0.9)
Monocytes: 5 %
Neutrophils Absolute: 4.4 10*3/uL (ref 1.4–7.0)
Neutrophils: 67 %
Platelets: 232 10*3/uL (ref 150–450)
RBC: 4.42 x10E6/uL (ref 3.77–5.28)
RDW: 12.3 % (ref 11.7–15.4)
WBC: 6.7 10*3/uL (ref 3.4–10.8)

## 2024-07-07 LAB — COMPREHENSIVE METABOLIC PANEL WITH GFR
ALT: 20 [IU]/L (ref 0–32)
AST: 14 [IU]/L (ref 0–40)
Albumin: 4.5 g/dL (ref 3.9–4.9)
Alkaline Phosphatase: 65 [IU]/L (ref 41–116)
BUN/Creatinine Ratio: 15 (ref 9–23)
BUN: 11 mg/dL (ref 6–24)
Bilirubin Total: 0.4 mg/dL (ref 0.0–1.2)
CO2: 20 mmol/L (ref 20–29)
Calcium: 9 mg/dL (ref 8.7–10.2)
Chloride: 105 mmol/L (ref 96–106)
Creatinine, Ser: 0.74 mg/dL (ref 0.57–1.00)
Globulin, Total: 2.4 g/dL (ref 1.5–4.5)
Glucose: 86 mg/dL (ref 70–99)
Potassium: 4.4 mmol/L (ref 3.5–5.2)
Sodium: 140 mmol/L (ref 134–144)
Total Protein: 6.9 g/dL (ref 6.0–8.5)
eGFR: 102 mL/min/{1.73_m2}

## 2024-07-07 LAB — IRON,TIBC AND FERRITIN PANEL
Ferritin: 99 ng/mL (ref 15–150)
Iron Saturation: 45 % (ref 15–55)
Iron: 151 ug/dL (ref 27–159)
Total Iron Binding Capacity: 337 ug/dL (ref 250–450)
UIBC: 186 ug/dL (ref 131–425)

## 2024-07-07 LAB — VITAMIN D 25 HYDROXY (VIT D DEFICIENCY, FRACTURES): Vit D, 25-Hydroxy: 42.2 ng/mL (ref 30.0–100.0)

## 2024-07-07 LAB — VITAMIN B12: Vitamin B-12: 469 pg/mL (ref 232–1245)

## 2024-07-07 LAB — EPSTEIN-BARR VIRUS NUCLEAR ANTIGEN ANTIBODY, IGG: EBV NA IgG: 447 U/mL — ABNORMAL HIGH (ref 0.0–17.9)

## 2024-07-07 LAB — TSH: TSH: 2.11 u[IU]/mL (ref 0.450–4.500)

## 2024-07-13 ENCOUNTER — Ambulatory Visit: Admitting: Family Medicine
